# Patient Record
Sex: Female | Born: 1950 | Race: Black or African American | Hispanic: No | Smoking: Former smoker
Health system: Southern US, Community
[De-identification: ages and names within clinical notes are randomized; demographics above are authoritative.]

## PROBLEM LIST (undated history)

## (undated) DIAGNOSIS — G4730 Sleep apnea, unspecified: Secondary | ICD-10-CM

## (undated) DIAGNOSIS — I10 Essential (primary) hypertension: Secondary | ICD-10-CM

## (undated) DIAGNOSIS — R0602 Shortness of breath: Secondary | ICD-10-CM

## (undated) DIAGNOSIS — J449 Chronic obstructive pulmonary disease, unspecified: Secondary | ICD-10-CM

## (undated) DIAGNOSIS — G8929 Other chronic pain: Secondary | ICD-10-CM

## (undated) DIAGNOSIS — K219 Gastro-esophageal reflux disease without esophagitis: Secondary | ICD-10-CM

## (undated) DIAGNOSIS — E079 Disorder of thyroid, unspecified: Secondary | ICD-10-CM

## (undated) DIAGNOSIS — M542 Cervicalgia: Secondary | ICD-10-CM

## (undated) HISTORY — PX: ABDOMINAL HYSTERECTOMY: SHX81

## (undated) HISTORY — PX: KNEE ARTHROSCOPY: SHX127

## (undated) HISTORY — PX: BACK SURGERY: SHX140

## (undated) HISTORY — PX: THYROID SURGERY: SHX805

## (undated) HISTORY — PX: CERVICAL DISC SURGERY: SHX588

## (undated) HISTORY — PX: CATARACT EXTRACTION: SUR2

## (undated) HISTORY — PX: OTHER SURGICAL HISTORY: SHX169

---

## 2011-05-10 ENCOUNTER — Observation Stay (HOSPITAL_COMMUNITY)
Admission: EM | Admit: 2011-05-10 | Discharge: 2011-05-11 | DRG: 143 | Disposition: A | Discharge status: Home / Self Care | Payer: BC Managed Care – PPO | Attending: Internal Medicine | Admitting: Internal Medicine

## 2011-05-10 ENCOUNTER — Encounter: Payer: Self-pay | Admitting: *Deleted

## 2011-05-10 ENCOUNTER — Other Ambulatory Visit: Payer: Self-pay

## 2011-05-10 ENCOUNTER — Emergency Department (HOSPITAL_COMMUNITY): Payer: BC Managed Care – PPO

## 2011-05-10 DIAGNOSIS — G8929 Other chronic pain: Secondary | ICD-10-CM | POA: Diagnosis present

## 2011-05-10 DIAGNOSIS — R079 Chest pain, unspecified: Secondary | ICD-10-CM | POA: Diagnosis present

## 2011-05-10 DIAGNOSIS — R0789 Other chest pain: Principal | ICD-10-CM | POA: Diagnosis present

## 2011-05-10 DIAGNOSIS — M542 Cervicalgia: Secondary | ICD-10-CM | POA: Diagnosis present

## 2011-05-10 DIAGNOSIS — E785 Hyperlipidemia, unspecified: Secondary | ICD-10-CM | POA: Diagnosis present

## 2011-05-10 DIAGNOSIS — K219 Gastro-esophageal reflux disease without esophagitis: Secondary | ICD-10-CM | POA: Diagnosis present

## 2011-05-10 DIAGNOSIS — I10 Essential (primary) hypertension: Secondary | ICD-10-CM | POA: Diagnosis present

## 2011-05-10 DIAGNOSIS — N289 Disorder of kidney and ureter, unspecified: Secondary | ICD-10-CM | POA: Diagnosis present

## 2011-05-10 HISTORY — DX: Cervicalgia: M54.2

## 2011-05-10 HISTORY — DX: Other chronic pain: G89.29

## 2011-05-10 HISTORY — DX: Disorder of thyroid, unspecified: E07.9

## 2011-05-10 HISTORY — DX: Gastro-esophageal reflux disease without esophagitis: K21.9

## 2011-05-10 HISTORY — DX: Essential (primary) hypertension: I10

## 2011-05-10 HISTORY — DX: Shortness of breath: R06.02

## 2011-05-10 LAB — BASIC METABOLIC PANEL
BUN: 15 mg/dL (ref 6–23)
CO2: 30 mEq/L (ref 19–32)
Calcium: 10.3 mg/dL (ref 8.4–10.5)
Chloride: 102 mEq/L (ref 96–112)
Creatinine, Ser: 1.11 mg/dL — ABNORMAL HIGH (ref 0.50–1.10)
GFR calc Af Amer: 60 mL/min — ABNORMAL LOW (ref >90)
GFR calc non Af Amer: 52 mL/min — ABNORMAL LOW (ref >90)
Glucose, Bld: 106 mg/dL — ABNORMAL HIGH (ref 70–99)
Potassium: 3.6 mEq/L (ref 3.5–5.1)
Sodium: 140 mEq/L (ref 135–145)

## 2011-05-10 LAB — CBC
HCT: 42 % (ref 36.0–46.0)
Hemoglobin: 13.1 g/dL (ref 12.0–15.0)
MCH: 25 pg — ABNORMAL LOW (ref 26.0–34.0)
MCHC: 31.2 g/dL (ref 30.0–36.0)
MCV: 80.3 fL (ref 78.0–100.0)
Platelets: 269 K/uL (ref 150–400)
RBC: 5.23 MIL/uL — ABNORMAL HIGH (ref 3.87–5.11)
RDW: 14.4 % (ref 11.5–15.5)
WBC: 5.1 K/uL (ref 4.0–10.5)

## 2011-05-10 LAB — TROPONIN I: Troponin I: <0.3 ng/mL

## 2011-05-10 MED ORDER — ONDANSETRON HCL 4 MG/2ML IJ SOLN: 4.0000 mg | Freq: Four times a day (QID) | INTRAMUSCULAR | Status: DC | PRN

## 2011-05-10 MED ORDER — HYDROCHLOROTHIAZIDE 25 MG PO TABS
12.5000 mg | ORAL_TABLET | ORAL | Status: DC
  Administered 2011-05-11: 12.5 mg via ORAL
  Filled 2011-05-10: qty 1

## 2011-05-10 MED ORDER — ONDANSETRON HCL 4 MG PO TABS: 4.0000 mg | ORAL_TABLET | Freq: Four times a day (QID) | ORAL | Status: DC | PRN

## 2011-05-10 MED ORDER — ENOXAPARIN SODIUM 40 MG/0.4ML ~~LOC~~ SOLN
40.0000 mg | SUBCUTANEOUS | Status: DC
  Administered 2011-05-10: 40 mg via SUBCUTANEOUS
  Filled 2011-05-10: qty 0.4

## 2011-05-10 MED ORDER — NITROGLYCERIN 2 % TD OINT: 0.5000 [in_us] | TOPICAL_OINTMENT | Freq: Once | TRANSDERMAL | Status: DC

## 2011-05-10 MED ORDER — ONDANSETRON HCL 4 MG/2ML IJ SOLN
4.0000 mg | Freq: Three times a day (TID) | INTRAMUSCULAR | Status: DC | PRN
  Administered 2011-05-10: 4 mg via INTRAVENOUS
  Filled 2011-05-10: qty 2

## 2011-05-10 MED ORDER — PANTOPRAZOLE SODIUM 40 MG PO TBEC
40.0000 mg | DELAYED_RELEASE_TABLET | Freq: Every day | ORAL | Status: DC
  Administered 2011-05-10 – 2011-05-11 (×2): 40 mg via ORAL
  Filled 2011-05-10 (×2): qty 1

## 2011-05-10 MED ORDER — SODIUM CHLORIDE 0.9 % IV SOLN: 250.0000 mL | INTRAVENOUS | Status: DC | PRN

## 2011-05-10 MED ORDER — ALUM & MAG HYDROXIDE-SIMETH 200-200-20 MG/5ML PO SUSP: 30.0000 mL | Freq: Four times a day (QID) | ORAL | Status: DC | PRN

## 2011-05-10 MED ORDER — SODIUM CHLORIDE 0.9 % IJ SOLN: 3.0000 mL | INTRAMUSCULAR | Status: DC | PRN

## 2011-05-10 MED ORDER — QUINAPRIL HCL 10 MG PO TABS
40.0000 mg | ORAL_TABLET | ORAL | Status: DC
  Filled 2011-05-10: qty 4

## 2011-05-10 MED ORDER — ALLOPURINOL 100 MG PO TABS
100.0000 mg | ORAL_TABLET | ORAL | Status: DC
  Administered 2011-05-11: 100 mg via ORAL
  Filled 2011-05-10: qty 1

## 2011-05-10 MED ORDER — NITROGLYCERIN 2 % TD OINT
TOPICAL_OINTMENT | TRANSDERMAL | Status: AC
  Administered 2011-05-10: 23:00:00
  Filled 2011-05-10: qty 1

## 2011-05-10 MED ORDER — ASPIRIN 81 MG PO CHEW
324.0000 mg | CHEWABLE_TABLET | Freq: Once | ORAL | Status: AC
  Administered 2011-05-10: 324 mg via ORAL
  Filled 2011-05-10: qty 4

## 2011-05-10 MED ORDER — MORPHINE SULFATE 2 MG/ML IJ SOLN: 1.0000 mg | INTRAMUSCULAR | Status: DC | PRN

## 2011-05-10 MED ORDER — SODIUM CHLORIDE 0.9 % IJ SOLN
3.0000 mL | Freq: Two times a day (BID) | INTRAMUSCULAR | Status: DC
  Administered 2011-05-10 – 2011-05-11 (×2): 3 mL via INTRAVENOUS
  Filled 2011-05-10 (×2): qty 3

## 2011-05-10 MED ORDER — GABAPENTIN 300 MG PO CAPS
300.0000 mg | ORAL_CAPSULE | Freq: Four times a day (QID) | ORAL | Status: DC
  Administered 2011-05-10 – 2011-05-11 (×3): 300 mg via ORAL
  Filled 2011-05-10 (×3): qty 1

## 2011-05-10 MED ORDER — MORPHINE SULFATE 4 MG/ML IJ SOLN
4.0000 mg | Freq: Once | INTRAMUSCULAR | Status: AC
  Administered 2011-05-10: 4 mg via INTRAVENOUS
  Filled 2011-05-10: qty 1

## 2011-05-10 MED ORDER — SODIUM CHLORIDE 0.9 % IV SOLN
INTRAVENOUS | Status: AC
  Administered 2011-05-10: 20:00:00 via INTRAVENOUS

## 2011-05-10 NOTE — ED Notes (Signed)
Patient report given to this nurse. Assuming care of patient. Patient resting sitting up in bed. No distress. Equal chest rise and fall. Pain 6\10 at this time. States she usually takes pain medication for her back four times a day. Notified to hold off on taking pain medication until MD evaluates. Verbalized understanding. Call bell within reach. Bed in low position and locked with side rails up.

## 2011-05-10 NOTE — ED Notes (Signed)
Report given to Marlou Porch, RN.

## 2011-05-10 NOTE — ED Notes (Signed)
Medicated as ordered for 7/10 chest pain. Stated during morphine infusion that pressure started in middle chest. Placed on 2L O2 [place]. Instructed to take slow, deep breaths. States she is feeling better.

## 2011-05-10 NOTE — ED Notes (Signed)
Left sided cp intermittently x 2 wks - worse today with left shoulder pain and left sided back pain.  C/o sob.  Denies dizziness/n/v.  Pt alert and oriented x 4, speaking clear full sentences at this time.

## 2011-05-10 NOTE — ED Notes (Signed)
Into room to see patient. States she is having 6\10 back pain. MD states she can take her home medication of Gabapentin 300mg  PO. States her chest pain is now 7\10 at this time. Would like something for it. Denies any shortness of breath. In no distress. Call bell within reach. MD made aware.

## 2011-05-10 NOTE — ED Provider Notes (Signed)
History   This chart was scribed for Tammy Razor, MD by Clarita Crane. The patient was seen in room APA12/APA12 and the patient's care was started at 5:52PM.   CSN: [id number] Arrival date & time: 05/10/2011  4:54 PM   First MD Initiated Contact with Patient 05/10/11 1716      Chief Complaint  Patient presents with  . Chest Pain    (Consider location/radiation/quality/duration/timing/severity/associated sxs/prior treatment) HPI Tammy Castaneda is a 60 y.o. female who presents to the Emergency Department complaining of intermittent moderate to severe chest pain radiating to left upper extremitiy and upper back onset 2 weeks ago but worse today with associated SOB with exertion and fatigue. Notes chest pain is not aggravated or relieved by anything. Denies rashes, abdominal pain, nausea, vomiting. Denies h/o PE, DVT, diabetes but reports h/o hypertension and thyroid disease.  Past Medical History  Diagnosis Date  . Hypertension   . Acid reflux   . Gout   . Thyroid disease     Past Surgical History  Procedure Date  . Abdominal hysterectomy     partial  . Knee arthroscopy     right  . Thyroid surgery   . Carpel tunnel     No family history on file.  History  Substance Use Topics  . Smoking status: Never Smoker   . Smokeless tobacco: Not on file  . Alcohol Use: No    OB History    Grav Para Term Preterm Abortions TAB SAB Ect Mult Living                  Review of Systems 10 Systems reviewed and are negative for acute change except as noted in the HPI.  Allergies  Atenolol; Betadine; Butalbital; Cephalexin; Ciprofloxacin hcl; Codeine; Duratuss; E-mycin; Hydrogen peroxide; Lidoderm; Lorabid; Lorcet; Lortab; Maxzide; Otocort; Penicillins; Pentazocine; Plendil; Prednisolone; Tetracyclines & related; Ultram; and Zinc  Home Medications   Current Outpatient Rx  Name Route Sig Dispense Refill  . ALLOPURINOL 100 MG PO TABS Oral Take 100 mg by mouth every morning.        Marland Kitchen VITAMIN D 1000 UNITS PO TABS Oral Take 1,000 Units by mouth every morning.      Marland Kitchen GABAPENTIN 300 MG PO CAPS Oral Take 300 mg by mouth 4 (four) times daily.      . [employer] LEG CRAMPS PAIN RELIEF PO TABS Oral Take 1 tablet by mouth daily as needed. For cramps     . HYDROCHLOROTHIAZIDE 25 MG PO TABS Oral Take 12.5 mg by mouth every morning.      . IBUPROFEN 200 MG PO TABS Oral Take 400 mg by mouth daily as needed. For pain     . OMEPRAZOLE 20 MG PO CPDR Oral Take 20 mg by mouth every morning.      Marland Kitchen POTASSIUM 99 MG PO TABS Oral Take 1 tablet by mouth every morning.      . QUINAPRIL HCL 40 MG PO TABS Oral Take 40 mg by mouth every morning.      Marland Kitchen VITAMIN C 500 MG PO TABS Oral Take 500 mg by mouth every morning.        Pulse 69  Temp(Src) 98.7 F (37.1 C) (Oral)  Resp 20  Ht 5\' 5"  (1.651 m)  Wt 189 lb (85.73 kg)  BMI 31.45 kg/m2  SpO2 97%  Physical Exam  Nursing note and vitals reviewed. Constitutional: She is oriented to person, place, and time. She appears well-developed and well-nourished. No distress.  HENT:  Head: Normocephalic and atraumatic.  Mouth/Throat: Oropharynx is clear and moist.  Eyes: EOM are normal. Pupils are equal, round, and reactive to light.  Neck: Neck supple. No tracheal deviation present.  Cardiovascular: Normal rate and regular rhythm.  Exam reveals no gallop and no friction rub.   No murmur heard.      DP pulses intact bilaterally.   Pulmonary/Chest: Effort normal. No respiratory distress. She has no wheezes.  Abdominal: Soft. She exhibits no distension. There is no tenderness.  Musculoskeletal: Normal range of motion. She exhibits edema (trace, bilateral lower extremities. ).  Neurological: She is alert and oriented to person, place, and time. No sensory deficit.  Skin: Skin is warm and dry.  Psychiatric: She has a normal mood and affect. Her behavior is normal.    ED Course  Procedures (including critical care time)  DIAGNOSTIC STUDIES: Oxygen  Saturation is 99% on room air, normal by my interpretation.    COORDINATION OF CARE: 5:57PM- Patient informed of current treatment plan and recommendation for admission to hospital for further evaluation and treatment.  7:42PM-Consult complete with hospitalist. Patient case explained and discussed. Hospitalist agrees to admit patient for further evaluation and treatment.    Labs Reviewed  CBC - Abnormal; Notable for the following:    RBC 5.23 (*)    MCH 25.0 (*)    All other components within normal limits  BASIC METABOLIC PANEL - Abnormal; Notable for the following:    Glucose, Bld 106 (*)    Creatinine, Ser 1.11 (*)    GFR calc non Af Amer 52 (*)    GFR calc Af Amer 60 (*)    All other components within normal limits  HEPATIC FUNCTION PANEL - Abnormal; Notable for the following:    Albumin 3.3 (*)    Total Bilirubin 0.2 (*)    All other components within normal limits  TROPONIN I  CARDIAC PANEL(CRET KIN+CKTOT+MB+TROPI)  LIPASE, BLOOD  CARDIAC PANEL(CRET KIN+CKTOT+MB+TROPI)  CARDIAC PANEL(CRET KIN+CKTOT+MB+TROPI)   EKG:  Rhythm: normal sinus Rate: 68  Axis: normal Intervals:normal ST segments: normal   Dg Chest 2 View  05/10/2011  *RADIOLOGY REPORT*  Clinical Data: Chest pain.  CHEST - 2 VIEW  Comparison: None  Findings: Heart and mediastinal contours are within normal limits. No focal opacities or effusions.  No acute bony abnormality.  IMPRESSION: No active disease.  Original Report Authenticated By: Cyndie Chime, M.D.     1. Chest pain       MDM  60yf with CP. Concern for possible cardiac etiology. EKG nondiagnostic and initial trop wnl. Hx concerning, especially increasing frequency and intensity of symptoms. Hx of gerd but says feels different. Hx of htn. Admit for r/o and fruther eval.      I personally preformed the services scribed in my presence. The recorded information has been reviewed and considered. Tammy Razor, MD.    Tammy Razor,  MD 05/11/11 [id number]

## 2011-05-10 NOTE — H&P (Signed)
Chief Complaint:  Chest pain  HPI: 60 year old female with a history of hypertension hyperlipidemia who presents to the emergency department with a two-week history of waxing and waning substernal chest pressure with some pressure also in her left upper extremity. She has significant heartburn and feels like this is somewhat like her heartburn but little worse. The pain has becoming more increasingly frequent she denies any nausea or vomiting with this or any abdominal pain. Denies any shortness of breath. She has not noticed if it's worse or better with food or with exertion. She has had a stress test of her heart done was over 5 years ago reported to be normal. She has no known cardiac history.  Review of Systems:  Otherwise negative  Past Medical History: Past Medical History  Diagnosis Date  . Hypertension   . Acid reflux   . Gout   . Thyroid disease   . Shortness of breath    Past Surgical History  Procedure Date  . Abdominal hysterectomy     partial  . Knee arthroscopy     right  . Thyroid surgery   . Carpel tunnel   . Back surgery     Medications: Prior to Admission medications   Medication Sig Start Date End Date Taking? Authorizing Provider  allopurinol (ZYLOPRIM) 100 MG tablet Take 100 mg by mouth every morning.     Yes Historical Provider, MD  cholecalciferol (VITAMIN D) 1000 UNITS tablet Take 1,000 Units by mouth every morning.     Yes Historical Provider, MD  gabapentin (NEURONTIN) 300 MG capsule Take 300 mg by mouth 4 (four) times daily.     Yes Historical Provider, MD  Homeopathic Products ([employer]S LEG CRAMPS PAIN RELIEF) TABS Take 1 tablet by mouth daily as needed. For cramps    Yes Historical Provider, MD  hydrochlorothiazide (HYDRODIURIL) 25 MG tablet Take 12.5 mg by mouth every morning.     Yes Historical Provider, MD  ibuprofen (ADVIL,MOTRIN) 200 MG tablet Take 400 mg by mouth daily as needed. For pain    Yes Historical Provider, MD  omeprazole (PRILOSEC) 20 MG  capsule Take 20 mg by mouth every morning.     Yes Historical Provider, MD  Potassium 99 MG TABS Take 1 tablet by mouth every morning.     Yes Historical Provider, MD  quinapril (ACCUPRIL) 40 MG tablet Take 40 mg by mouth every morning.     Yes Historical Provider, MD  vitamin C (ASCORBIC ACID) 500 MG tablet Take 500 mg by mouth every morning.     Yes Historical Provider, MD    Allergies:   Allergies  Allergen Reactions  . Atenolol   . Betadine (Povidone Iodine)   . Butalbital   . Cephalexin   . Ciprofloxacin Hcl   . Codeine   . Duratuss ([place])   . E-Mycin (Erythromycin Base)   . Hydrogen Peroxide   . Lidoderm   . Lorabid (Loracarbef)   . Lorcet (Hydrocodone-Acetaminophen)   . Lortab   . Maxzide   . Otocort (Neomycin-Polymyxin-Hc)   . Penicillins   . Pentazocine   . Plendil   . Prednisolone   . Tetracyclines & Related     Turns eyes black  . Ultram (Tramadol Hcl)   . Zinc     Social History:  reports that she has quit smoking. Her smoking use included Cigarettes. She does not have any smokeless tobacco history on file. She reports that she does not drink alcohol or use illicit  drugs.  Family History: History reviewed. No pertinent family history.  Physical Exam: Filed Vitals:   05/10/11 1840 05/10/11 2012 05/10/11 2035 05/10/11 2111  BP: 156/76 141/74 154/74 153/83  Pulse: 67 74 73 61  Temp:   98.7 F (37.1 C) 97.3 F (36.3 C)  TempSrc:  Oral Oral   Resp: 16 20  20   Height:    5\' 5"  (1.651 m)  Weight:    84.1 kg (185 lb 6.5 oz)  SpO2: 97% 96% 99% 96%   General appearance: alert, cooperative and no distress Resp: clear to auscultation bilaterally Cardio: regular rate and rhythm, S1, S2 normal, no murmur, click, rub or gallop GI: soft, non-tender; bowel sounds normal; no masses,  no organomegaly Extremities: extremities normal, atraumatic, no cyanosis or edema Pulses: 2+ and symmetric Skin: Skin color, texture, turgor normal. No rashes or  lesions Neurologic: Grossly normal   Labs on Admission:   [facility] 05/10/11 1719  NA 140  K 3.6  CL 102  CO2 30  GLUCOSE 106*  BUN 15  CREATININE 1.11*  CALCIUM 10.3  MG --  PHOS --    Basename 05/10/11 1719  WBC 5.1  NEUTROABS --  HGB 13.1  HCT 42.0  MCV 80.3  PLT 269    Basename 05/10/11 1719  CKTOTAL --  CKMB --  CKMBINDEX --  TROPONINI <0.30    Radiological Exams on Admission: Dg Chest 2 View  05/10/2011  *RADIOLOGY REPORT*  Clinical Data: Chest pain.  CHEST - 2 VIEW  Comparison: None  Findings: Heart and mediastinal contours are within normal limits. No focal opacities or effusions.  No acute bony abnormality.  IMPRESSION: No active disease.  Original Report Authenticated By: Cyndie Chime, M.D.    Assessment/Plan Present on Admission:  .Chest pain place on telemetry 12-lead EKG is negative we'll serial cardiac enzymes and obtain a 2-D echo in the morning. She will be complete workup with outpatient stress testing unless her enzymes are positive here. We'll place her on some nitroglycerin paste at this time until she rules out. We'll also add on a lipase level and LFTs due to the atypical nature of her chest pain which could be GI in etiology.  Marland KitchenHTN (hypertension) stable  .HLD (hyperlipidemia) stable  .GERD (gastroesophageal reflux disease) Protonix  Kirsta Probert A 05/10/2011, 10:15 PM

## 2011-05-10 NOTE — ED Notes (Signed)
Attempted to call report. Nurse will call back for report

## 2011-05-11 ENCOUNTER — Encounter (HOSPITAL_COMMUNITY): Payer: Self-pay | Admitting: Internal Medicine

## 2011-05-11 ENCOUNTER — Other Ambulatory Visit: Payer: Self-pay

## 2011-05-11 DIAGNOSIS — G8929 Other chronic pain: Secondary | ICD-10-CM

## 2011-05-11 DIAGNOSIS — I517 Cardiomegaly: Secondary | ICD-10-CM

## 2011-05-11 DIAGNOSIS — M542 Cervicalgia: Secondary | ICD-10-CM | POA: Diagnosis present

## 2011-05-11 HISTORY — DX: Other chronic pain: G89.29

## 2011-05-11 LAB — CARDIAC PANEL(CRET KIN+CKTOT+MB+TROPI)
CK, MB: 3.1 ng/mL (ref 0.3–4.0)
Troponin I: <0.3 ng/mL (ref <0.30)

## 2011-05-11 LAB — TSH: TSH: 2.989 u[IU]/mL (ref 0.350–4.500)

## 2011-05-11 LAB — HEPATIC FUNCTION PANEL
AST: 17 U/L (ref 0–37)
Albumin: 3.3 g/dL — ABNORMAL LOW (ref 3.5–5.2)
Total Protein: 6.6 g/dL (ref 6.0–8.3)

## 2011-05-11 LAB — LIPASE, BLOOD: Lipase: 30 U/L (ref 11–59)

## 2011-05-11 LAB — T4, FREE: Free T4: 0.9 ng/dL (ref 0.80–1.80)

## 2011-05-11 MED ORDER — NITROGLYCERIN 2 % TD OINT: 0.5000 [in_us] | TOPICAL_OINTMENT | Freq: Three times a day (TID) | TRANSDERMAL | Status: DC

## 2011-05-11 MED ORDER — OXYCODONE-ACETAMINOPHEN 2.5-325 MG PO TABS: 1.0000 | ORAL_TABLET | ORAL | Status: AC | PRN

## 2011-05-11 MED ORDER — IBUPROFEN 800 MG PO TABS
400.0000 mg | ORAL_TABLET | ORAL | Status: DC | PRN
Start: 2011-05-11 — End: 2011-05-11
  Administered 2011-05-11: 400 mg via ORAL
  Filled 2011-05-11: qty 1

## 2011-05-11 MED ORDER — LISINOPRIL 10 MG PO TABS
40.0000 mg | ORAL_TABLET | ORAL | Status: DC
  Administered 2011-05-11: 40 mg via ORAL
  Filled 2011-05-11: qty 4

## 2011-05-11 NOTE — Progress Notes (Signed)
*  PRELIMINARY RESULTS* Echocardiogram 2D Echocardiogram has been performed.  Tammy Castaneda 05/11/2011, 10:18 AM

## 2011-05-11 NOTE — Progress Notes (Signed)
Pt discharged home with family member.  Instructed to make follow up appts with PCP and neuro MDs.  Pt instructed on how to take new meds, to follow and low sodium, heart healthy diet, and to increase activity slowly.  Pt given MD note to excuse from work.  Discharged with no complaints, and verbalizes understanding of discharge instructions

## 2011-05-11 NOTE — Discharge Summary (Signed)
Physician Discharge Summary  Tammy Castaneda MRN: [id number] DOB/AGE: [birth year] 62 y.o.  PCP: [place] care doctor is in [place], [place]   Admit date: 05/10/2011 Discharge date: 05/11/2011  Discharge Diagnoses:  1. Chest pain, myocardial infarction ruled out. 2. Chronic neck pain with a history of cervical disc surgery in the past. This may be contributing to her chest pain. No obvious radicular symptoms were noted. 3. Hypertension, well controlled. 4. Gastroesophageal reflux disease. 5. Acute renal insufficiency.   Current Discharge Medication List    START taking these medications   Details  oxycodone-acetaminophen (PERCOCET) 2.5-325 MG per tablet Take 1 tablet by mouth every 4 (four) hours as needed for pain. Qty: 30 tablet, Refills: 0      CONTINUE these medications which have NOT CHANGED   Details  allopurinol (ZYLOPRIM) 100 MG tablet Take 100 mg by mouth every morning.      cholecalciferol (VITAMIN D) 1000 UNITS tablet Take 1,000 Units by mouth every morning.      gabapentin (NEURONTIN) 300 MG capsule Take 300 mg by mouth 4 (four) times daily.      Homeopathic Products (CVS LEG CRAMPS PAIN RELIEF) TABS Take 1 tablet by mouth daily as needed. For cramps     hydrochlorothiazide (HYDRODIURIL) 25 MG tablet Take 12.5 mg by mouth every morning.      ibuprofen (ADVIL,MOTRIN) 200 MG tablet Take 400 mg by mouth daily as needed. For pain     omeprazole (PRILOSEC) 20 MG capsule Take 20 mg by mouth every morning.      Potassium 99 MG TABS Take 1 tablet by mouth every morning.      quinapril (ACCUPRIL) 40 MG tablet Take 40 mg by mouth every morning.      vitamin C (ASCORBIC ACID) 500 MG tablet Take 500 mg by mouth every morning.          Discharge Condition: Improved and stable.  Disposition: Home.   Consults: None.  Significant Diagnostic Studies: Dg Chest 2 View  05/10/2011  *RADIOLOGY REPORT*  Clinical Data: Chest pain.  CHEST - 2 VIEW  Comparison: None   Findings: Heart and mediastinal contours are within normal limits. No focal opacities or effusions.  No acute bony abnormality.  IMPRESSION: No active disease.  Original Report Authenticated By: Cyndie Chime, M.D.   ECHO: Pending  Microbiology: No results found for this or any previous visit (from the past 240 hour(s)).   Labs: Results for orders placed during the hospital encounter of 05/10/11 (from the past 48 hour(s))  CBC     Status: Abnormal   Collection Time   05/10/11  5:19 PM      Component Value Range Comment   WBC 5.1  4.0 - 10.5 (K/uL)    RBC 5.23 (*) 3.87 - 5.11 (MIL/uL)    Hemoglobin 13.1  12.0 - 15.0 (g/dL)    HCT 40.9  81.1 - 91.4 (%)    MCV 80.3  78.0 - 100.0 (fL)    MCH 25.0 (*) 26.0 - 34.0 (pg)    MCHC 31.2  30.0 - 36.0 (g/dL)    RDW 78.2  95.6 - 21.3 (%)    Platelets 269  150 - 400 (K/uL)   BASIC METABOLIC PANEL     Status: Abnormal   Collection Time   05/10/11  5:19 PM      Component Value Range Comment   Sodium 140  135 - 145 (mEq/L)    Potassium 3.6  3.5 - 5.1 (  mEq/L)    Chloride 102  96 - 112 (mEq/L)    CO2 30  19 - 32 (mEq/L)    Glucose, Bld 106 (*) 70 - 99 (mg/dL)    BUN 15  6 - 23 (mg/dL)    Creatinine, Ser 1.61 (*) 0.50 - 1.10 (mg/dL)    Calcium 09.6  8.4 - 10.5 (mg/dL)    GFR calc non Af Amer 52 (*) >90 (mL/min)    GFR calc Af Amer 60 (*) >90 (mL/min)   TROPONIN I     Status: Normal   Collection Time   05/10/11  5:19 PM      Component Value Range Comment   Troponin I <0.30  <0.30 (ng/mL)   CARDIAC PANEL(CRET KIN+CKTOT+MB+TROPI)     Status: Normal   Collection Time   05/10/11 11:54 PM      Component Value Range Comment   Total CK 151  7 - 177 (U/L)    CK, MB 3.6  0.3 - 4.0 (ng/mL)    Troponin I <0.30  <0.30 (ng/mL)    Relative Index 2.4  0.0 - 2.5    HEPATIC FUNCTION PANEL     Status: Abnormal   Collection Time   05/11/11 12:00 AM      Component Value Range Comment   Total Protein 6.6  6.0 - 8.3 (g/dL)    Albumin 3.3 (*) 3.5 - 5.2  (g/dL)    AST 17  0 - 37 (U/L)    ALT 14  0 - 35 (U/L)    Alkaline Phosphatase 90  39 - 117 (U/L)    Total Bilirubin 0.2 (*) 0.3 - 1.2 (mg/dL)    Bilirubin, Direct <0.4  0.0 - 0.3 (mg/dL)    Indirect Bilirubin NOT CALCULATED  0.3 - 0.9 (mg/dL)   LIPASE, BLOOD     Status: Normal   Collection Time   05/11/11 12:00 AM      Component Value Range Comment   Lipase 30  11 - 59 (U/L)   CARDIAC PANEL(CRET KIN+CKTOT+MB+TROPI)     Status: Abnormal   Collection Time   05/11/11  7:33 AM      Component Value Range Comment   Total CK 120  7 - 177 (U/L)    CK, MB 3.1  0.3 - 4.0 (ng/mL)    Troponin I <0.30  <0.30 (ng/mL)    Relative Index 2.6 (*) 0.0 - 2.5       HPI : The patient is a 60 year old woman with a past medical history significant for hypertension, gastroesophageal reflux disease, and cervical disc surgery, who presented to the emergency department on 05/10/2011 with a chief complaint of substernal chest pain and associated left chest pain with left arm discomfort. In the emergency department, she was noted to be mildly hypertensive, but otherwise afebrile and hemodynamically stable. Her lab data revealed a slightly increased creatinine of 1.11 and normal troponin I. Her chest x-ray revealed no active disease. Her EKG revealed normal sinus rhythm with a heart rate of 68 beats per minute and no ST or T wave abnormalities. She was admitted for further evaluation and management.  HOSPITAL COURSE: The patient was started empirically on nitroglycerin paste. Morphine was added when necessary for pain. Aspirin was started empirically. She was restarted on her chronic medications. For further evaluation, a number of studies were ordered. All of her cardiac enzymes were well within normal limits. Her followup EKG revealed normal sinus rhythm with a heart rate of 71 beats per  minute and no ST or T wave abnormalities. Her lipase and hepatic function panel were within normal limits. The results of her 2-D  echocardiogram, TSH, and free T4 were pending at the time of discharge. She complained of a headache which was thought to be secondary to the nitroglycerin paste. It was subsequently discontinued and she was given as needed ibuprofen.  Upon further questioning of the patient, she reported that she had chronic neck pain and  had cervical disc surgery at [facility] last year. She also reported that she does heavy lifting at work. It is possible, that the patient has radiating pain from her neck to her chest and arm. She was advised to followup with the surgeon who performed her cervical disc surgery in the next 2-4 weeks. She was advised to followup with her primary care physician regarding work/lifting limitations. She was given a prescription for Percocet as needed for pain. Of note, the patient has no true allergy to oxycodone, rather, she becomes very sleepy when she takes it. She was advised not to drive when taking Percocet.  The patient became virtually chest pain free. She remained hemodynamically stable and afebrile. She was discharged to home in improved and stable condition. She will followup with her neurosurgeon and primary care physician as recommended.    Discharge Exam: Blood pressure 115/72, pulse 61, temperature 97.8 F (36.6 C), temperature source Oral, resp. rate 20, height 5\' 5"  (1.651 m), weight 84.1 kg (185 lb 6.5 oz), SpO2 95.00%. Neck: Supple, good range of motion, mild cervical muscular tenderness posteriorly. Lungs: Clear to auscultation bilaterally. Chest wall, no pectoral tenderness. Heart: S1, S2, with no murmurs rubs or gallops. Abdomen: Obese, positive bowel sounds, nontender, and nondistended. Extremities: No pedal edema. Neurologic: Alert and oriented x3. Cranial nerves II through XII are intact. Strength in both arms is 5 over 5 throughout. Sensation is grossly intact in all of the extremities and symmetrically.   Discharge Orders    Future Orders Please Complete By  Expires   Diet - low sodium heart healthy      Increase activity slowly      Discharge instructions      Comments:   FOLLOW UP WITH YOUR NECK SURGEON AS SCHEDULED IN A FEW WEEKS.        Follow-up Information    Please follow up. (F/UP WITH YOUR NECK SURGEON IN 2-4 WEEKS)       Please follow up. (F/UP WITH YOUR PRIMARY DOCTOR IN 1 WEEK.)          Signed: Sujata Maines 05/11/2011, 2:15 PM

## 2018-12-18 ENCOUNTER — Other Ambulatory Visit: Payer: Self-pay | Admitting: Podiatry

## 2019-01-01 NOTE — Patient Instructions (Signed)
Tammy Castaneda  01/01/2019     @PREFPERIOPPHARMACY @   Your procedure is scheduled on  01/09/2019.  Report to [facility] at  700  A.M.  Call this number if you have problems the morning of surgery:  [phone]   Remember:  Do not eat or drink after midnight.                         Take these medicines the morning of surgery with A SIP OF WATER  Allopurinol, gabapentin, prilosec.    Do not wear jewelry, make-up or nail polish.  Do not wear lotions, powders, or perfumes, or deodorant.  Do not shave 48 hours prior to surgery.  Men may shave face and neck.  Do not bring valuables to the hospital.  [facility] is not responsible for any belongings or valuables.  Contacts, dentures or bridgework may not be worn into surgery.  Leave your suitcase in the car.  After surgery it may be brought to your room.  For patients admitted to the hospital, discharge time will be determined by your treatment team.  Patients discharged the day of surgery will not be allowed to drive home.   Name and phone number of your driver:   family Special instructions:  None  Please read over the following fact sheets that you were given. Anesthesia Post-op Instructions and Care and Recovery After Surgery       Metatarsal Osteotomy, Care After This sheet gives you information about how to care for yourself after your procedure. Your health care provider may also give you more specific instructions. If you have problems or questions, contact your health care provider. What can I expect after the procedure? After the procedure, it is common to have:  Soreness.  Pain.  Stiffness.  Swelling. Follow these instructions at home: Medicines  Take over-the-counter and prescription medicines only as told by your health care provider.  Ask your health care provider if the medicine prescribed to you can cause constipation. You may need to take steps to prevent or treat constipation, such as:  ? Drink enough fluid to keep your urine pale yellow. ? Take over-the-counter or prescription medicines. ? Eat foods that are high in fiber, such as beans, whole grains, and fresh fruits and vegetables. ? Limit foods that are high in fat and processed sugars, such as fried or sweet foods. If you have a splint or walking boot:  Wear it as told by your health care provider. Remove it only as told by your health care provider.  Loosen it if your toes tingle, become numb, or turn cold and blue.  Keep it clean.  If it is not waterproof: ? Do not let it get wet. ? Cover it with a watertight covering when you take a bath or shower. Bathing  Do not take baths, swim, or use a hot tub until your health care provider approves. Ask your health care provider if you may take showers. You may only be allowed to take sponge baths.  Keep the bandage (dressing) dry. Incision care   Follow instructions from your health care provider about how to take care of your incision. Make sure you: ? Wash your hands with soap and water before you change your bandage (dressing). If soap and water are not available, use hand sanitizer. ? Change your dressing as told by your health care provider. ? Leave stitches (sutures),  skin glue, or adhesive strips in place. These skin closures may need to stay in place for 2 weeks or longer. If adhesive strip edges start to loosen and curl up, you may trim the loose edges. Do not remove adhesive strips completely unless your health care provider tells you to do that.  Check your incision area every day for signs of infection. Check for: ? More redness, swelling, or pain. ? Fluid or blood. ? Warmth. ? Pus or a bad smell. Managing pain, stiffness, and swelling   If directed, put ice on the injured area. ? If you have a removable splint, remove it as told by your health care provider. ? Put ice in a plastic bag. ? Place a towel between your skin and the bag. ? Leave the  ice on for 20 minutes, 2-3 times a day.  Move your toes often to avoid stiffness and to lessen swelling.  Raise (elevate) the injured area above the level of your heart while you are sitting or lying down. Driving  Do not drive or use heavy machinery while taking prescription pain medicine.  Do not drive for 24 hours if you were given a sedative during your procedure.  Ask your health care provider when it is safe to drive if you have a dressing, splint, special shoe, or walking boot on your foot. General instructions  Do not remove the bandage (dressing) around your foot until directed by your health care provider.  If you were given a splint, special shoe, or walking boot, wear it as told by your health care provider.  Do not use the injured limb to support your body weight until your health care provider says that you can. Use crutches or a walker as told by your health care provider.  Return to your normal activities as told by your health care provider. Ask your health care provider what activities are safe for you.  Do not use any products that contain nicotine or tobacco, such as cigarettes, e-cigarettes, and chewing tobacco. These can delay bone healing. If you need help quitting, ask your health care provider.  Keep all follow-up visits as told by your health care provider. This is important. Contact a health care provider if:  You have a fever.  Your dressing becomes wet, loose, or stained with blood or discharge.  You have pus or a bad smell coming from your incision or bandage.  Your foot becomes red, swollen, or tender.  You have pain or stiffness that does not get better or gets worse.  You have tingling or numbness in your foot that does not get better or gets worse. Get help right away if:  You develop a warm and tender swelling in your leg.  You have chest pain.  You have trouble breathing. Summary  After the procedure, it is common to have soreness,  pain, stiffness, and swelling.  Follow instructions on caring for your incision, changing your dressing, and using weight support to protect your foot.  Contact your health care provider if you have a fever, pus or a bad smell coming from your wound or dressing, or pain and stiffness do not get better.  Get help right away if you develop a warm and tender swelling in your leg, have chest pain, or trouble breathing. This information is not intended to replace advice given to you by your health care provider. Make sure you discuss any questions you have with your health care provider. Document Released: 04/27/2015 Document Revised:  03/30/2018 Document Reviewed: 03/30/2018 Elsevier Patient Education  2020 [employer]  Monitored Anesthesia Care, Care After These instructions provide you with information about caring for yourself after your procedure. Your health care provider may also give you more specific instructions. Your treatment has been planned according to current medical practices, but problems sometimes occur. Call your health care provider if you have any problems or questions after your procedure. What can I expect after the procedure? After your procedure, you may:  Feel sleepy for several hours.  Feel clumsy and have poor balance for several hours.  Feel forgetful about what happened after the procedure.  Have poor judgment for several hours.  Feel nauseous or vomit.  Have a sore throat if you had a breathing tube during the procedure. Follow these instructions at home: For at least 24 hours after the procedure:      Have a responsible adult stay with you. It is important to have someone help care for you until you are awake and alert.  Rest as needed.  Do not: ? Participate in activities in which you could fall or become injured. ? Drive. ? Use heavy machinery. ? Drink alcohol. ? Take sleeping pills or medicines that cause drowsiness. ? Make important  decisions or sign legal documents. ? Take care of children on your own. Eating and drinking  Follow the diet that is recommended by your health care provider.  If you vomit, drink water, juice, or soup when you can drink without vomiting.  Make sure you have little or no nausea before eating solid foods. General instructions  Take over-the-counter and prescription medicines only as told by your health care provider.  If you have sleep apnea, surgery and certain medicines can increase your risk for breathing problems. Follow instructions from your health care provider about wearing your sleep device: ? Anytime you are sleeping, including during daytime naps. ? While taking prescription pain medicines, sleeping medicines, or medicines that make you drowsy.  If you smoke, do not smoke without supervision.  Keep all follow-up visits as told by your health care provider. This is important. Contact a health care provider if:  You keep feeling nauseous or you keep vomiting.  You feel light-headed.  You develop a rash.  You have a fever. Get help right away if:  You have trouble breathing. Summary  For several hours after your procedure, you may feel sleepy and have poor judgment.  Have a responsible adult stay with you for at least 24 hours or until you are awake and alert. This information is not intended to replace advice given to you by your health care provider. Make sure you discuss any questions you have with your health care provider. Document Released: 09/06/2015 Document Revised: 08/14/2017 Document Reviewed: 09/06/2015 Elsevier Patient Education  2020 [employer] How to Use Chlorhexidine for Bathing Chlorhexidine gluconate (CHG) is a germ-killing (antiseptic) solution that is used to clean the skin. It can get rid of the bacteria that normally live on the skin and can keep them away for about 24 hours. To clean your skin with CHG, you may be given:  A CHG solution to  use in the shower or as part of a sponge bath.  A prepackaged cloth that contains CHG. Cleaning your skin with CHG may help lower the risk for infection:  While you are staying in the intensive care unit of the hospital.  If you have a vascular access, such as a central line, to provide short-term  or long-term access to your veins.  If you have a catheter to drain urine from your bladder.  If you are on a ventilator. A ventilator is a machine that helps you breathe by moving air in and out of your lungs.  After surgery. What are the risks? Risks of using CHG include:  A skin reaction.  Hearing loss, if CHG gets in your ears.  Eye injury, if CHG gets in your eyes and is not rinsed out.  The CHG product catching fire. Make sure that you avoid smoking and flames after applying CHG to your skin. Do not use CHG:  If you have a chlorhexidine allergy or have previously reacted to chlorhexidine.  On babies younger than 482 months of age. How to use CHG solution  Use CHG only as told by your health care provider, and follow the instructions on the label.  Use the full amount of CHG as directed. Usually, this is one bottle. During a shower Follow these steps when using CHG solution during a shower (unless your health care provider gives you different instructions): 1. Start the shower. 2. Use your normal soap and shampoo to wash your face and hair. 3. Turn off the shower or move out of the shower stream. 4. Pour the CHG onto a clean washcloth. Do not use any type of brush or rough-edged sponge. 5. Starting at your neck, lather your body down to your toes. Make sure you follow these instructions: ? If you will be having surgery, pay special attention to the part of your body where you will be having surgery. Scrub this area for at least 1 minute. ? Do not use CHG on your head or face. If the solution gets into your ears or eyes, rinse them well with water. ? Avoid your genital area. ?  Avoid any areas of skin that have broken skin, cuts, or scrapes. ? Scrub your back and under your arms. Make sure to wash skin folds. 6. Let the lather sit on your skin for 1-2 minutes or as long as told by your health care provider. 7. Thoroughly rinse your entire body in the shower. Make sure that all body creases and crevices are rinsed well. 8. Dry off with a clean towel. Do not put any substances on your body afterward-such as powder, lotion, or perfume-unless you are told to do so by your health care provider. Only use lotions that are recommended by the manufacturer. 9. Put on clean clothes or pajamas. 10. If it is the night before your surgery, sleep in clean sheets.  During a sponge bath Follow these steps when using CHG solution during a sponge bath (unless your health care provider gives you different instructions): 1. Use your normal soap and shampoo to wash your face and hair. 2. Pour the CHG onto a clean washcloth. 3. Starting at your neck, lather your body down to your toes. Make sure you follow these instructions: ? If you will be having surgery, pay special attention to the part of your body where you will be having surgery. Scrub this area for at least 1 minute. ? Do not use CHG on your head or face. If the solution gets into your ears or eyes, rinse them well with water. ? Avoid your genital area. ? Avoid any areas of skin that have broken skin, cuts, or scrapes. ? Scrub your back and under your arms. Make sure to wash skin folds. 4. Let the lather sit on your skin for  1-2 minutes or as long as told by your health care provider. 5. Using a different clean, wet washcloth, thoroughly rinse your entire body. Make sure that all body creases and crevices are rinsed well. 6. Dry off with a clean towel. Do not put any substances on your body afterward-such as powder, lotion, or perfume-unless you are told to do so by your health care provider. Only use lotions that are recommended by  the manufacturer. 7. Put on clean clothes or pajamas. 8. If it is the night before your surgery, sleep in clean sheets. How to use CHG prepackaged cloths  Only use CHG cloths as told by your health care provider, and follow the instructions on the label.  Use the CHG cloth on clean, dry skin.  Do not use the CHG cloth on your head or face unless your health care provider tells you to.  When washing with the CHG cloth: ? Avoid your genital area. ? Avoid any areas of skin that have broken skin, cuts, or scrapes. Before surgery Follow these steps when using a CHG cloth to clean before surgery (unless your health care provider gives you different instructions): 1. Using the CHG cloth, vigorously scrub the part of your body where you will be having surgery. Scrub using a back-and-forth motion for 3 minutes. The area on your body should be completely wet with CHG when you are done scrubbing. 2. Do not rinse. Discard the cloth and let the area air-dry. Do not put any substances on the area afterward, such as powder, lotion, or perfume. 3. Put on clean clothes or pajamas. 4. If it is the night before your surgery, sleep in clean sheets.  For general bathing Follow these steps when using CHG cloths for general bathing (unless your health care provider gives you different instructions). 1. Use a separate CHG cloth for each area of your body. Make sure you wash between any folds of skin and between your fingers and toes. Wash your body in the following order, switching to a new cloth after each step: ? The front of your neck, shoulders, and chest. ? Both of your arms, under your arms, and your hands. ? Your stomach and groin area, avoiding the genitals. ? Your right leg and foot. ? Your left leg and foot. ? The back of your neck, your back, and your buttocks. 2. Do not rinse. Discard the cloth and let the area air-dry. Do not put any substances on your body afterward-such as powder, lotion, or  perfume-unless you are told to do so by your health care provider. Only use lotions that are recommended by the manufacturer. 3. Put on clean clothes or pajamas. Contact a health care provider if:  Your skin gets irritated after scrubbing.  You have questions about using your solution or cloth. Get help right away if:  Your eyes become very red or swollen.  Your eyes itch badly.  Your skin itches badly and is red or swollen.  Your hearing changes.  You have trouble seeing.  You have swelling or tingling in your mouth or throat.  You have trouble breathing.  You swallow any chlorhexidine. Summary  Chlorhexidine gluconate (CHG) is a germ-killing (antiseptic) solution that is used to clean the skin. Cleaning your skin with CHG may help to lower your risk for infection.  You may be given CHG to use for bathing. It may be in a bottle or in a prepackaged cloth to use on your skin. Carefully follow your health  care provider's instructions and the instructions on the product label.  Do not use CHG if you have a chlorhexidine allergy.  Contact your health care provider if your skin gets irritated after scrubbing. This information is not intended to replace advice given to you by your health care provider. Make sure you discuss any questions you have with your health care provider. Document Released: 02/08/2012 Document Revised: 08/02/2018 Document Reviewed: 04/13/2017 Elsevier Patient Education  2020 [employer].

## 2019-01-04 ENCOUNTER — Encounter (HOSPITAL_COMMUNITY): Payer: Self-pay

## 2019-01-04 ENCOUNTER — Other Ambulatory Visit: Payer: Self-pay

## 2019-01-04 ENCOUNTER — Ambulatory Visit (HOSPITAL_COMMUNITY)
Admission: RE | Admit: 2019-01-04 | Discharge: 2019-01-04 | Disposition: A | Discharge status: Home / Self Care | Payer: Medicare Other | Source: Ambulatory Visit | Attending: Podiatry | Admitting: Podiatry

## 2019-01-04 ENCOUNTER — Encounter (HOSPITAL_COMMUNITY)
Admission: RE | Admit: 2019-01-04 | Discharge: 2019-01-04 | Disposition: A | Discharge status: Home / Self Care | Payer: Medicare Other | Source: Ambulatory Visit | Attending: Podiatry | Admitting: Podiatry

## 2019-01-04 ENCOUNTER — Other Ambulatory Visit (HOSPITAL_COMMUNITY)
Admission: RE | Admit: 2019-01-04 | Discharge: 2019-01-04 | Disposition: A | Discharge status: Home / Self Care | Payer: Medicare Other | Source: Ambulatory Visit | Attending: Podiatry | Admitting: Podiatry

## 2019-01-04 DIAGNOSIS — M79671 Pain in right foot: Secondary | ICD-10-CM | POA: Insufficient documentation

## 2019-01-04 DIAGNOSIS — Z01818 Encounter for other preprocedural examination: Secondary | ICD-10-CM | POA: Insufficient documentation

## 2019-01-04 DIAGNOSIS — Z20828 Contact with and (suspected) exposure to other viral communicable diseases: Secondary | ICD-10-CM | POA: Insufficient documentation

## 2019-01-04 HISTORY — DX: Chronic obstructive pulmonary disease, unspecified: J44.9

## 2019-01-04 HISTORY — DX: Sleep apnea, unspecified: G47.30

## 2019-01-04 LAB — BASIC METABOLIC PANEL
Anion gap: 9 (ref 5–15)
BUN: 22 mg/dL (ref 8–23)
CO2: 27 mmol/L (ref 22–32)
Calcium: 9.5 mg/dL (ref 8.9–10.3)
Chloride: 104 mmol/L (ref 98–111)
Creatinine, Ser: 1.19 mg/dL — ABNORMAL HIGH (ref 0.44–1.00)
GFR calc Af Amer: 54 mL/min — ABNORMAL LOW (ref >60)
GFR calc non Af Amer: 47 mL/min — ABNORMAL LOW (ref >60)
Glucose, Bld: 88 mg/dL (ref 70–99)
Potassium: 3.7 mmol/L (ref 3.5–5.1)
Sodium: 140 mmol/L (ref 135–145)

## 2019-01-05 LAB — SARS CORONAVIRUS 2 (TAT 6-24 HRS): SARS Coronavirus 2: NEGATIVE

## 2019-01-09 ENCOUNTER — Ambulatory Visit (HOSPITAL_COMMUNITY): Payer: Medicare Other | Admitting: Anesthesiology

## 2019-01-09 ENCOUNTER — Encounter (HOSPITAL_COMMUNITY)
Admission: RE | Disposition: A | Discharge status: Home / Self Care | Payer: Self-pay | Source: Home / Self Care | Attending: Podiatry

## 2019-01-09 ENCOUNTER — Encounter (HOSPITAL_COMMUNITY): Payer: Self-pay | Admitting: *Deleted

## 2019-01-09 ENCOUNTER — Ambulatory Visit (HOSPITAL_COMMUNITY)
Admission: RE | Admit: 2019-01-09 | Discharge: 2019-01-09 | Disposition: A | Discharge status: Home / Self Care | Payer: Medicare Other | Attending: Podiatry | Admitting: Podiatry

## 2019-01-09 ENCOUNTER — Ambulatory Visit (HOSPITAL_COMMUNITY): Payer: Medicare Other

## 2019-01-09 ENCOUNTER — Other Ambulatory Visit: Payer: Self-pay

## 2019-01-09 DIAGNOSIS — Z9889 Other specified postprocedural states: Secondary | ICD-10-CM

## 2019-01-09 DIAGNOSIS — K219 Gastro-esophageal reflux disease without esophagitis: Secondary | ICD-10-CM | POA: Insufficient documentation

## 2019-01-09 DIAGNOSIS — Z79899 Other long term (current) drug therapy: Secondary | ICD-10-CM | POA: Diagnosis not present

## 2019-01-09 DIAGNOSIS — J449 Chronic obstructive pulmonary disease, unspecified: Secondary | ICD-10-CM | POA: Diagnosis not present

## 2019-01-09 DIAGNOSIS — Q742 Other congenital malformations of lower limb(s), including pelvic girdle: Secondary | ICD-10-CM | POA: Diagnosis present

## 2019-01-09 DIAGNOSIS — M205X1 Other deformities of toe(s) (acquired), right foot: Secondary | ICD-10-CM | POA: Insufficient documentation

## 2019-01-09 DIAGNOSIS — Z7982 Long term (current) use of aspirin: Secondary | ICD-10-CM | POA: Diagnosis not present

## 2019-01-09 DIAGNOSIS — G4730 Sleep apnea, unspecified: Secondary | ICD-10-CM | POA: Insufficient documentation

## 2019-01-09 DIAGNOSIS — M2011 Hallux valgus (acquired), right foot: Secondary | ICD-10-CM | POA: Insufficient documentation

## 2019-01-09 DIAGNOSIS — M2041 Other hammer toe(s) (acquired), right foot: Secondary | ICD-10-CM | POA: Insufficient documentation

## 2019-01-09 DIAGNOSIS — Z87891 Personal history of nicotine dependence: Secondary | ICD-10-CM | POA: Insufficient documentation

## 2019-01-09 DIAGNOSIS — I10 Essential (primary) hypertension: Secondary | ICD-10-CM | POA: Insufficient documentation

## 2019-01-09 DIAGNOSIS — M79671 Pain in right foot: Secondary | ICD-10-CM | POA: Diagnosis not present

## 2019-01-09 HISTORY — PX: BUNIONECTOMY: SHX129

## 2019-01-09 HISTORY — PX: HAMMER TOE SURGERY: SHX385

## 2019-01-09 HISTORY — PX: FOOT ARTHRODESIS: SHX1655

## 2019-01-09 HISTORY — PX: EXCISION OF ACCESSORY OSSICLE: SHX6460

## 2019-01-09 SURGERY — EXCISION OF ACCESSORY OSSICLE
Anesthesia: General | Laterality: Right

## 2019-01-09 MED ORDER — CHLORHEXIDINE GLUCONATE CLOTH 2 % EX PADS: 6.0000 | MEDICATED_PAD | Freq: Once | CUTANEOUS | Status: DC

## 2019-01-09 MED ORDER — MIDAZOLAM HCL 5 MG/5ML IJ SOLN
INTRAMUSCULAR | Status: DC | PRN
  Administered 2019-01-09 (×2): 1 mg via INTRAVENOUS

## 2019-01-09 MED ORDER — LIDOCAINE HCL (PF) 1 % IJ SOLN
INTRAMUSCULAR | Status: AC
  Filled 2019-01-09: qty 2

## 2019-01-09 MED ORDER — LACTATED RINGERS IV SOLN
INTRAVENOUS | Status: DC
  Administered 2019-01-09: 1000 mL via INTRAVENOUS
  Administered 2019-01-09: 11:00:00 via INTRAVENOUS

## 2019-01-09 MED ORDER — MIDAZOLAM HCL 2 MG/2ML IJ SOLN: 0.5000 mg | Freq: Once | INTRAMUSCULAR | Status: DC | PRN

## 2019-01-09 MED ORDER — FENTANYL CITRATE (PF) 250 MCG/5ML IJ SOLN
INTRAMUSCULAR | Status: AC
  Filled 2019-01-09: qty 5

## 2019-01-09 MED ORDER — FENTANYL CITRATE (PF) 100 MCG/2ML IJ SOLN
INTRAMUSCULAR | Status: AC
  Filled 2019-01-09: qty 2

## 2019-01-09 MED ORDER — SODIUM CHLORIDE 0.9 % IR SOLN
Status: DC | PRN
  Administered 2019-01-09: 1000 mL

## 2019-01-09 MED ORDER — BUPIVACAINE HCL (PF) 0.5 % IJ SOLN
INTRAMUSCULAR | Status: AC
  Filled 2019-01-09: qty 90

## 2019-01-09 MED ORDER — PROPOFOL 10 MG/ML IV BOLUS
INTRAVENOUS | Status: DC | PRN
  Administered 2019-01-09: 20 mg via INTRAVENOUS
  Administered 2019-01-09: 10 mg via INTRAVENOUS
  Administered 2019-01-09 (×3): 20 mg via INTRAVENOUS
  Administered 2019-01-09: 10 mg via INTRAVENOUS

## 2019-01-09 MED ORDER — BUPIVACAINE HCL (PF) 0.5 % IJ SOLN
INTRAMUSCULAR | Status: DC | PRN
  Administered 2019-01-09: 20 mL
  Administered 2019-01-09: 30 mL

## 2019-01-09 MED ORDER — CLINDAMYCIN PHOSPHATE 900 MG/50ML IV SOLN
900.0000 mg | INTRAVENOUS | Status: AC
  Administered 2019-01-09: 09:00:00 900 mg via INTRAVENOUS
  Filled 2019-01-09: qty 50

## 2019-01-09 MED ORDER — PROPOFOL 500 MG/50ML IV EMUL
INTRAVENOUS | Status: DC | PRN
  Administered 2019-01-09: 75 ug/kg/min via INTRAVENOUS

## 2019-01-09 MED ORDER — BACITRACIN 500 UNIT/GM EX OINT
TOPICAL_OINTMENT | CUTANEOUS | Status: DC | PRN
  Administered 2019-01-09: 1 via TOPICAL

## 2019-01-09 MED ORDER — KETAMINE HCL 10 MG/ML IJ SOLN
INTRAMUSCULAR | Status: DC | PRN
  Administered 2019-01-09: 10 mg via INTRAVENOUS

## 2019-01-09 MED ORDER — BACITRACIN ZINC 500 UNIT/GM EX OINT
TOPICAL_OINTMENT | CUTANEOUS | Status: AC
  Filled 2019-01-09: qty 0.9

## 2019-01-09 MED ORDER — KETAMINE HCL 50 MG/5ML IJ SOSY
PREFILLED_SYRINGE | INTRAMUSCULAR | Status: AC
  Filled 2019-01-09: qty 5

## 2019-01-09 MED ORDER — LIDOCAINE HCL (PF) 1 % IJ SOLN
INTRAMUSCULAR | Status: AC
  Filled 2019-01-09: qty 30

## 2019-01-09 MED ORDER — MIDAZOLAM HCL 2 MG/2ML IJ SOLN
INTRAMUSCULAR | Status: AC
  Filled 2019-01-09: qty 2

## 2019-01-09 MED ORDER — FENTANYL CITRATE (PF) 100 MCG/2ML IJ SOLN
INTRAMUSCULAR | Status: DC | PRN
  Administered 2019-01-09 (×3): 50 ug via INTRAVENOUS
  Administered 2019-01-09 (×2): 25 ug via INTRAVENOUS

## 2019-01-09 MED ORDER — PROMETHAZINE HCL 25 MG/ML IJ SOLN: 6.2500 mg | INTRAMUSCULAR | Status: DC | PRN

## 2019-01-09 MED ORDER — PROPOFOL 10 MG/ML IV BOLUS
INTRAVENOUS | Status: AC
  Filled 2019-01-09: qty 40

## 2019-01-09 MED ORDER — LIDOCAINE HCL (PF) 1 % IJ SOLN
INTRAMUSCULAR | Status: DC | PRN
  Administered 2019-01-09: 10 mL

## 2019-01-09 SURGICAL SUPPLY — 58 items
BANDAGE ACE 4X5 VEL STRL LF (GAUZE/BANDAGES/DRESSINGS) ×2 IMPLANT
BANDAGE ELASTIC 4 LF NS (GAUZE/BANDAGES/DRESSINGS) ×2 IMPLANT
BANDAGE ESMARK 4X12 BL STRL LF (DISPOSABLE) ×1 IMPLANT
BENZOIN TINCTURE PRP APPL 2/3 (GAUZE/BANDAGES/DRESSINGS) ×2 IMPLANT
BIT DRILL LNG QR ACUTRAK 2 (BIT) ×2 IMPLANT
BIT DRILL MICR ACTRK 2 LNG PRF (BIT) ×1 IMPLANT
BLADE AVERAGE 25X9 (BLADE) ×2 IMPLANT
BLADE OSC/SAG 11.5X5.5X.38 (BLADE) ×2 IMPLANT
BLADE SURG 15 STRL LF DISP TIS (BLADE) ×2 IMPLANT
BLADE SURG 15 STRL SS (BLADE) ×2
BNDG CONFORM 2 STRL LF (GAUZE/BANDAGES/DRESSINGS) ×2 IMPLANT
BNDG ESMARK 4X12 BLUE STRL LF (DISPOSABLE) ×2
BNDG GAUZE ELAST 4 BULKY (GAUZE/BANDAGES/DRESSINGS) ×2 IMPLANT
CAP PIN PROTECTOR ORTHO WHT (CAP) ×4 IMPLANT
CHLORAPREP W/TINT 26 (MISCELLANEOUS) ×2 IMPLANT
CLOTH BEACON ORANGE TIMEOUT ST (SAFETY) ×2 IMPLANT
COVER LIGHT HANDLE STERIS (MISCELLANEOUS) ×4 IMPLANT
COVER WAND RF STERILE (DRAPES) ×2 IMPLANT
CUFF TOURNIQUET SINGLE 18IN (TOURNIQUET CUFF) ×2 IMPLANT
DECANTER SPIKE VIAL GLASS SM (MISCELLANEOUS) ×4 IMPLANT
DRAPE OEC MINIVIEW 54X84 (DRAPES) ×2 IMPLANT
DRILL MICRO ACUTRAK 2 LNG PROF (BIT) ×2
DRSG ADAPTIC 3X8 NADH LF (GAUZE/BANDAGES/DRESSINGS) ×2 IMPLANT
DURA STEPPER MED (CAST SUPPLIES) ×2 IMPLANT
ELECT REM PT RETURN 9FT ADLT (ELECTROSURGICAL) ×2
ELECTRODE REM PT RTRN 9FT ADLT (ELECTROSURGICAL) ×1 IMPLANT
GAUZE SPONGE 4X4 12PLY STRL (GAUZE/BANDAGES/DRESSINGS) ×4 IMPLANT
GLOVE BIO SURGEON STRL SZ7.5 (GLOVE) ×2 IMPLANT
GLOVE BIOGEL PI IND STRL 7.0 (GLOVE) ×4 IMPLANT
GLOVE BIOGEL PI IND STRL 7.5 (GLOVE) ×1 IMPLANT
GLOVE BIOGEL PI INDICATOR 7.0 (GLOVE) ×4
GLOVE BIOGEL PI INDICATOR 7.5 (GLOVE) ×1
GLOVE ECLIPSE 6.5 STRL STRAW (GLOVE) ×2 IMPLANT
GLOVE ECLIPSE 7.0 STRL STRAW (GLOVE) ×2 IMPLANT
GOWN STRL REUS W/ TWL LRG LVL3 (GOWN DISPOSABLE) ×1 IMPLANT
GOWN STRL REUS W/TWL LRG LVL3 (GOWN DISPOSABLE) ×9 IMPLANT
GUIDEWIRE ORTHO MICROSHT  ACUT (WIRE) ×1
GUIDEWIRE ORTHO MICROSHT .035 (WIRE) ×1 IMPLANT
K-WIRE 6 (WIRE) ×2
KIT TURNOVER KIT A (KITS) ×2 IMPLANT
KWIRE 6 (WIRE) ×1 IMPLANT
MANIFOLD NEPTUNE II (INSTRUMENTS) ×2 IMPLANT
NEEDLE HYPO 21X1.5 SAFETY (NEEDLE) ×2 IMPLANT
NEEDLE HYPO 27GX1-1/4 (NEEDLE) ×8 IMPLANT
NS IRRIG 1000ML POUR BTL (IV SOLUTION) ×2 IMPLANT
PACK BASIC LIMB (CUSTOM PROCEDURE TRAY) ×2 IMPLANT
PAD ABD 5X9 TENDERSORB (GAUZE/BANDAGES/DRESSINGS) ×2 IMPLANT
PAD ARMBOARD 7.5X6 YLW CONV (MISCELLANEOUS) ×2 IMPLANT
SCREW ACUTRAK 2 STD 22MM (Screw) ×2 IMPLANT
SCREW ACUTRAK 2 STD 34MM (Screw) ×2 IMPLANT
SET BASIN LINEN APH (SET/KITS/TRAYS/PACK) ×2 IMPLANT
SPONGE LAP 18X18 RF (DISPOSABLE) ×2 IMPLANT
STRIP CLOSURE SKIN 1/2X4 (GAUZE/BANDAGES/DRESSINGS) ×2 IMPLANT
SUT ETHILON 4 0 P 3 18 (SUTURE) ×2 IMPLANT
SUT VIC AB 2-0 CT2 27 (SUTURE) ×2 IMPLANT
SUT VIC AB 4-0 PS2 27 (SUTURE) ×2 IMPLANT
SUT VICRYL AB 3-0 FS1 BRD 27IN (SUTURE) ×2 IMPLANT
SYR CONTROL 10ML LL (SYRINGE) ×6 IMPLANT

## 2019-01-09 NOTE — Brief Op Note (Signed)
BRIEF OPERATIVE NOTE  DATE OF PROCEDURE 01/09/2019  SURGEON Marcheta Grammes, DPM  ASSISTANT SURGEON Jilda Panda, DPM  OR STAFF Circulator: Jeanell Sparrow Fransisco Hertz, RN Relief Circulator: Edrick Kins, RN Relief Scrub: Lucie Leather, CST Scrub Person: Towanda Malkin, RN Vendor Representative : Georgiana Spinner   PREOPERATIVE DIAGNOSIS 1.  Hallux limitus, right foot 2.  Hallux valgus, right foot 3.  Hallux extensus, right foot 4.  Accessory bone, right hallux 5.  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  Pain, right foot  POSTOPERATIVE DIAGNOSIS Same  PROCEDURE 1.  Modified Waterman osteotomy of first metatarsal, right foot 2.  Excision of accessory bone, right hallux 3.  Arthrodesis of the interphalangeal joint, right hallux 4.  Hammertoe repair of the 2nd digit, right foot  ANESTHESIA Monitor Anesthesia Care   HEMOSTASIS Pneumatic ankle tourniquet set at 250 mmHg  ESTIMATED BLOOD LOSS Minimal (<5 cc)  MATERIALS USED Screw x 2 0.045" K-wire  INJECTABLES 0.5% Marcaine plain  PATHOLOGY Accessory bone, right hallux  COMPLICATIONS None

## 2019-01-09 NOTE — H&P (Signed)
HISTORY AND PHYSICAL INTERVAL NOTE:  01/09/2019  8:16 AM  Tammy Castaneda  has presented today for surgery, with the diagnosis of accessory bone right hallux, hallux extensus, hallux limitus, hallux valgus, pain right foot.  The various methods of treatment have been discussed with the patient.  No guarantees were given.  After consideration of risks, benefits and other options for treatment, the patient has consented to surgery.  I have reviewed the patients' chart and labs.    Patient Vitals for the past 24 hrs:  BP Temp Temp src Resp SpO2  01/09/19 0744 (!) 147/72 98.4 F (36.9 C) Oral 18 97 %    A history and physical examination was performed in my office.  The patient was reexamined.  There have been no changes to this history and physical examination.  Marcheta Grammes, DPM

## 2019-01-09 NOTE — Anesthesia Postprocedure Evaluation (Signed)
Anesthesia Post Note  Patient: Tammy Castaneda  Procedure(s) Performed: EXCISION OF ACCESSORY BONE RIGHT HALLUX (Right ) ARTHRODESIS INTERPHALANGEAL JOINT RIGHT HALLUX (Right ) GREEN WATERMANN BUNIONECTOMY RIGHT FOOT (Right ) HAMMER TOE CORRECTION 2ND TOE RIGHT FOOT (Right )  Patient location during evaluation: PACU Anesthesia Type: General Level of consciousness: awake and alert and oriented Pain management: pain level controlled Vital Signs Assessment: post-procedure vital signs reviewed and stable Respiratory status: spontaneous breathing Cardiovascular status: blood pressure returned to baseline and stable Postop Assessment: no apparent nausea or vomiting Anesthetic complications: no     Last Vitals:  Vitals:   01/09/19 1115 01/09/19 1135  BP: (!) 136/99 133/76  Pulse: 71 67  Resp: 16 16  Temp:  (!) 36.4 C  SpO2: 98% 94%    Last Pain:  Vitals:   01/09/19 1135  TempSrc: Oral  PainSc: 0-No pain                 Gerene Nedd

## 2019-01-09 NOTE — Op Note (Signed)
OPERATIVE NOTE  DATE OF PROCEDURE 01/09/2019  SURGEON Marcheta Grammes, DPM  ASSISTANT SURGEON Jilda Panda, DPM  OR STAFF Circulator: Jeanell Sparrow Fransisco Hertz, RN Relief Circulator: Edrick Kins, RN Relief Scrub: Lucie Leather, CST Scrub Person: Towanda Malkin, RN Vendor Representative : Georgiana Spinner   PREOPERATIVE DIAGNOSIS 1.  Hallux limitus, right foot 2.  Hallux valgus, right foot 3.  Hallux extensus, right foot 4.  Accessory bone, right hallux 5.  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  Pain, right foot  POSTOPERATIVE DIAGNOSIS Same  PROCEDURE 1.  Modified Waterman osteotomy of first metatarsal, right foot 2.  Excision of accessory bone, right hallux 3.  Arthrodesis of the interphalangeal joint, right hallux 4.  Hammertoe repair of the 2nd digit, right foot  ANESTHESIA Monitor Anesthesia Care   HEMOSTASIS Pneumatic ankle tourniquet set at 250 mmHg  ESTIMATED BLOOD LOSS Minimal (<5 cc)  MATERIALS USED Screw x 2 0.045" K-wire  INJECTABLES 0.5% Marcaine plain  PATHOLOGY Accessory bone, right hallux  COMPLICATIONS None  INDICATIONS:  Persistent right foot pain associated with bunion and hammertoe deformities nonresponsive to nonsurgical care.  DESCRIPTION OF THE PROCEDURE:  The patient was brought to the operating room and placed on the operative table in the supine position.  A pneumatic ankle tourniquet was placed about the patient's right ankle.  A timeout was performed.  The foot was anesthetized with 0.5% Marcaine plain.  The foot was scrubbed prepped and draped in the usual sterile manner.  A second timeout was performed.  The limb was elevated, exsanguinated and the pneumatic ankle tourniquet was inflated to 250 mmHg.  Attention was directed to the dorsal medial aspect of the right foot.  A linear longitudinal incision was made medial and parallel to the extensor hallucis longus tendon.  The dissection was continued deep down to the level of the  first metatarsophalangeal joint.  A linear longitudinal periosteal and capsular incision was performed.  The periosteal and capsular structures were reflected medially and laterally exposing the first metatarsal, proximal phalanx and distal phalanx.  The medial eminence of the first metatarsal head was resected using a power bone saw.  A portion of the dorsal first metatarsal was resected to improve first metatarsophalangeal joint range of motion.  Attention was redirected to the medial aspect of the first metatarsal head.  A chevron osteotomy was performed.  A second osteotomy was performed proximal and parallel to the dorsal wing of the chevron osteotomy to decompress the first metatarsophalangeal joint.  A small wafer of bone was removed.  The capital fragment was shifted laterally and impacted upon the first metatarsal shaft.  The osteotomy was fixated with an Acumed screw.  Position of the screw was confirmed with fluoroscopy.  Attention was directed to the dorsal aspect of the interphalangeal joint of the hallux.  The articular surface was resected from the head of the proximal and distal phalanges.  A wedge of bone with the base oriented medially was resected from the arthrodesis site to correct for the hallux interphalangeus.  Attention was directed to the plantar aspect of the interphalangeal joint.  An accessory ossicle was identified and removed.  Bone was sent to pathology for evaluation.  The flexor hallucis longus tendon was evaluated and found to be intact with no laceration.  The interphalangeal joint was fixated with Acumed 4.0 screw.  Adequate compression was achieved.  Position of the screw was confirmed with fluoroscopy.  The surgical wound was irrigated with copious amounts of  sterile irrigant.  The periosteal and capsular structures were reapproximated using 3-0 Vicryl.  The subcutaneous structures were reapproximated using 4-0 Vicryl.  The skin was reapproximated using 4-0  nylon.  Attention was directed to the dorsal aspect of the proximal interphalangeal joint of the second digit.  A transverse incision was made.  Dissection was continued deep down to the level of the proximal interphalangeal joint.  A transverse tenotomy and capsulotomy was performed.  The head of the proximal phalanx was resected using a power bone saw.  The toe was fixated using a 0.045 inch Kirschner wire.  The wire was bent, cut and capped.  The wound was irrigated with copious amounts of sterile irrigant.  The extensor tendon was reapproximated using 4-0 Vicryl.  The skin was reapproximated using 4-0 nylon.  Additional 0.5% Marcaine plain was injected for postoperative pain control..  A sterile compressive dressing was applied to the right foot.  The pneumatic ankle tourniquet was deflated and a prompt hyperemic response was noted to all digits of the right foot.  The patient tolerated the procedures well.  She was transferred from the operating room to the postanesthesia care unit with vital signs stable and vascular status intact to all digits of the left foot.

## 2019-01-09 NOTE — Transfer of Care (Signed)
Immediate Anesthesia Transfer of Care Note  Patient: Tammy Castaneda  Procedure(s) Performed: EXCISION OF ACCESSORY BONE RIGHT HALLUX (Right ) ARTHRODESIS INTERPHALANGEAL JOINT RIGHT HALLUX (Right ) GREEN WATERMANN BUNIONECTOMY RIGHT FOOT (Right ) HAMMER TOE CORRECTION 2ND TOE RIGHT FOOT (Right )  Patient Location: PACU  Anesthesia Type:MAC  Level of Consciousness: awake  Airway & Oxygen Therapy: Patient Spontanous Breathing  Post-op Assessment: Report given to RN  Post vital signs: Reviewed and stable  Last Vitals:  Vitals Value Taken Time  BP 148/131 01/09/19 1046  Temp    Pulse 75 01/09/19 1055  Resp 12 01/09/19 1055  SpO2 100 % 01/09/19 1055  Vitals shown include unvalidated device data.  Last Pain:  Vitals:   01/09/19 0744  TempSrc: Oral  PainSc: 6       Patients Stated Pain Goal: 8 (23/95/32 0233)  Complications: No apparent anesthesia complications

## 2019-01-09 NOTE — Discharge Instructions (Signed)
These instructions will give you an idea of what to expect after surgery and how to manage issues that may arise before your first post op office visit.  Pain Management Pain is best managed by staying ahead of it. If pain gets out of control, it is difficult to get it back under control. Local anesthesia that lasts 6-8 hours is used to numb the foot and decrease pain.  For the best pain control, take the pain medication every 4 hours for the first 2 days post op. On the third day pain medication can be taken as needed.   Post Op Nausea Nausea is common after surgery, so it is managed proactively.  If prescribed, use the prescribed nausea medication regularly for the first 2 days post op.  Bandages Do not worry if there is blood on the bandage. What looks like a lot of blood on the bandage is actually a small amount. Blood on the dressing spreads out as it is absorbed by the gauze, the same way a drop of water spreads out on a paper towel.  If the bandages feel wet or dry, stiff and uncomfortable, call the office during office hours and we will schedule a time for you to have the bandage changed.  Unless you are specifically told otherwise, we will do the first bandage change in the office.  Keep your bandage dry. If the bandage becomes wet or soiled, notify the office and we will schedule a time to change the bandage.  Activity It is best to spend most of the first 2 days after surgery lying down with the foot elevated above the level of your heart. You may put weight on your heel while wearing the CAM Walker (black boot).   You may only get up to go to the restroom.  Driving Do not drive until you are able to respond in an emergency (i.e. slam on the brakes). This usually occurs after the bone has healed - 6 to 8 weeks.  Call the Office If you have a fever over 101F.  If you have increasing pain after the initial post op pain has settled down.  If you have increasing redness, swelling,  or drainage.  If you have any questions or concerns.   Monitored Anesthesia Care, Care After These instructions provide you with information about caring for yourself after your procedure. Your health care provider may also give you more specific instructions. Your treatment has been planned according to current medical practices, but problems sometimes occur. Call your health care provider if you have any problems or questions after your procedure. What can I expect after the procedure? After your procedure, you may:  Feel sleepy for several hours.  Feel clumsy and have poor balance for several hours.  Feel forgetful about what happened after the procedure.  Have poor judgment for several hours.  Feel nauseous or vomit.  Have a sore throat if you had a breathing tube during the procedure. Follow these instructions at home: For at least 24 hours after the procedure:      Have a responsible adult stay with you. It is important to have someone help care for you until you are awake and alert.  Rest as needed.  Do not: ? Participate in activities in which you could fall or become injured. ? Drive. ? Use heavy machinery. ? Drink alcohol. ? Take sleeping pills or medicines that cause drowsiness. ? Make important decisions or sign legal documents. ? Take care of children  on your own. Eating and drinking  Follow the diet that is recommended by your health care provider.  If you vomit, drink water, juice, or soup when you can drink without vomiting.  Make sure you have little or no nausea before eating solid foods. General instructions  Take over-the-counter and prescription medicines only as told by your health care provider.  If you have sleep apnea, surgery and certain medicines can increase your risk for breathing problems. Follow instructions from your health care provider about wearing your sleep device: ? Anytime you are sleeping, including during daytime naps. ? While  taking prescription pain medicines, sleeping medicines, or medicines that make you drowsy.  If you smoke, do not smoke without supervision.  Keep all follow-up visits as told by your health care provider. This is important. Contact a health care provider if:  You keep feeling nauseous or you keep vomiting.  You feel light-headed.  You develop a rash.  You have a fever. Get help right away if:  You have trouble breathing. Summary  For several hours after your procedure, you may feel sleepy and have poor judgment.  Have a responsible adult stay with you for at least 24 hours or until you are awake and alert. This information is not intended to replace advice given to you by your health care provider. Make sure you discuss any questions you have with your health care provider. Document Released: 09/06/2015 Document Revised: 08/14/2017 Document Reviewed: 09/06/2015 Elsevier Patient Education  2020 [employer]

## 2019-01-09 NOTE — Anesthesia Preprocedure Evaluation (Signed)
Anesthesia Evaluation  Patient identified by MRN, date of birth, ID band Patient awake    Reviewed: Allergy & Precautions, NPO status , Patient's Chart, lab work & pertinent test results  Airway Mallampati: II  TM Distance: >3 FB Neck ROM: Full    Dental no notable dental hx. (+) Teeth Intact   Pulmonary shortness of breath, sleep apnea , COPD,  COPD inhaler, former smoker,    Pulmonary exam normal breath sounds clear to auscultation       Cardiovascular Exercise Tolerance: Good hypertension, Pt. on medications negative cardio ROS Normal cardiovascular examI Rhythm:Regular Rate:Normal     Neuro/Psych negative neurological ROS  negative psych ROS   GI/Hepatic Neg liver ROS, GERD  Medicated and Controlled,  Endo/Other  negative endocrine ROS  Renal/GU negative Renal ROS  negative genitourinary   Musculoskeletal negative musculoskeletal ROS (+)   Abdominal   Peds negative pediatric ROS (+)  Hematology negative hematology ROS (+)   Anesthesia Other Findings   Reproductive/Obstetrics negative OB ROS                             Anesthesia Physical Anesthesia Plan  ASA: III  Anesthesia Plan: General   Post-op Pain Management:    Induction: Intravenous  PONV Risk Score and Plan: 3 and Ondansetron, Dexamethasone, Treatment may vary due to age or medical condition, Propofol infusion, TIVA and Midazolam  Airway Management Planned: Nasal Cannula and Simple Face Mask  Additional Equipment:   Intra-op Plan:   Post-operative Plan:   Informed Consent: I have reviewed the patients History and Physical, chart, labs and discussed the procedure including the risks, benefits and alternatives for the proposed anesthesia with the patient or authorized representative who has indicated his/her understanding and acceptance.     Dental advisory given  Plan Discussed with: CRNA  Anesthesia Plan  Comments: (Plan Full PPE use  Plan MAC with GA vs GETA as needed )        Anesthesia Quick Evaluation

## 2019-01-10 ENCOUNTER — Encounter (HOSPITAL_COMMUNITY): Payer: Self-pay | Admitting: Podiatry

## 2019-01-10 NOTE — Addendum Note (Signed)
Addendum  created 01/10/19 1056 by Mickel Baas, CRNA   Charge Capture section accepted

## 2021-09-15 ENCOUNTER — Other Ambulatory Visit: Payer: Self-pay | Admitting: Podiatry

## 2021-10-05 NOTE — Patient Instructions (Signed)
? ? ? ? ? ? ? ? Tammy Castaneda ? 10/05/2021  ?  ? @PREFPERIOPPHARMACY @ ? ? Your procedure is scheduled on  10/08/2021. ? ? [facility]  [facility] A.M. ? ? Call this number if you have problems the morning of surgery: ? [phone] ? ? Remember: ? Do not eat or drink after midnight. ?  ? ?  Use your inhaler before you come and bring your rescue inhaler with you. ?  ? ? Take these medicines the morning of surgery with A SIP OF WATER  ? ?             allopurinol, carvedilol, gabapentin, prilosec. ?  ? ? Do not wear jewelry, make-up or nail polish. ? Do not wear lotions, powders, or perfumes, or deodorant. ? Do not shave 48 hours prior to surgery.  Men may shave face and neck. ? Do not bring valuables to the hospital. ? [facility] is not responsible for any belongings or valuables. ? ?Contacts, dentures or bridgework may not be worn into surgery.  Leave your suitcase in the car.  After surgery it may be brought to your room. ? ?For patients admitted to the hospital, discharge time will be determined by your treatment team. ? ?Patients discharged the day of surgery will not be allowed to drive home and must have someone with them for 24 hours.  ? ? ?Special instructions:   DO NOT smoke tobacco or vape for 24 hours before your procedure. ? ?Please read over the following fact sheets that you were given. ?Coughing and Deep Breathing, Surgical Site Infection Prevention, Anesthesia Post-op Instructions, and Care and Recovery After Surgery ?  ? ? ? Bunion Surgery, Care After ?This sheet gives you information about how to care for yourself after your procedure. Your health care provider may also give you more specific instructions. If you have problems or questions, contact your health care provider. ?What can I expect after the procedure? ?After the procedure, it is common to have: ?Redness. ?Pain. ?Swelling. ?A small amount of fluid coming from your incision. ?Follow these instructions at home: ?If you have a  post-operative brace, boot, or shoe: ?Wear the post-op (post-operative) brace, boot, or shoe as told by your health care provider. Remove it only as told by your health care provider. ?Loosen the brace, boot, or shoe if your toes tingle, become numb, or turn cold and blue. ?Keep the brace, boot, or shoe clean and dry. ?If you have a cast: ?Do not put pressure on any part of the cast until it is fully hardened. This may take several hours. ?Do not stick anything inside the cast to scratch your skin. Doing that increases your risk of infection. ?Check the skin around the cast every day. Tell your health care provider about any concerns. ?You may put lotion on dry skin around the edges of the cast. Do not put lotion on the skin underneath the cast. ?Keep the cast clean and dry. ?Bathing ?Do not take baths, swim, or use a hot tub until your health care provider approves. Ask your health care provider if you may take showers. ?If your brace, boot, shoe, or cast is not waterproof: ?Do not let it get wet. ?Cover it with a watertight covering when you take a bath or a shower. ?Keep your bandage (dressing) dry until your health care provider says it can be removed. ?Incision care ? ?The dressing holds your toe in the correct position. Do not  change the dressing until your health care provider approves. ?Follow instructions from your health care provider about how to take care of your incision. Make sure you: ?Wash your hands with soap and water for at least 20 seconds before and after you change your dressing. If soap and water are not available, use hand sanitizer. ?Change your dressing as told by your health care provider. ?Leave stitches (sutures), skin glue, or adhesive strips in place. These skin closures may need to stay in place for 2 weeks or longer. If adhesive strip edges start to loosen and curl up, you may trim the loose edges. Do not remove adhesive strips completely unless your health care provider tells you to  do that. ?Check your incision area every day for signs of infection. Check for: ?More redness, swelling, or pain. ?Blood or more fluid. ?Warmth. ?Pus or a bad smell. ?Managing pain, stiffness, and swelling ? ?If directed, put ice on the affected area. To do this: ?If you have a removable brace, boot, or shoe, remove it as told by your health care provider. ?Put ice in a plastic bag. ?Place a towel between your skin and the bag or between your cast and the bag. ?Leave the ice on for 20 minutes, 2-3 times a day. ?Remove the ice if your skin turns bright red. This is very important. If you cannot feel pain, heat, or cold, you have a greater risk of damage to the area. ?Move your toes often to reduce stiffness and swelling. ?Raise (elevate) the injured area above the level of your heart while you are sitting or lying down. ?Activity ?Return to your normal activities as told by your health care provider. Ask your health care provider what activities are safe for you. ?If physical therapy was prescribed, do exercises as told by your health care provider. ?Driving ?If you were given a sedative during the procedure, it can affect you for several hours. Do not drive or operate machinery until your health care provider says that it is safe. ?Ask your health care provider when it is safe to drive if you have a brace, boot, shoe, or cast on your foot. ?Safety ?Do not use the affected leg to support (bear) your body weight until your health care provider says that you can. Follow weight-bearing restrictions as told. Use crutches, a cane, or a walker as told by your health care provider. ?General instructions ?Do not use any products that contain nicotine or tobacco, such as cigarettes, e-cigarettes, and chewing tobacco. If you need help quitting, ask your health care provider. ?Take over-the-counter and prescription medicines only as told by your health care provider. ?Your medicines may cause constipation. To prevent or treat  constipation, you may need to: ?Drink enough fluid to keep your urine pale yellow. ?Take over-the-counter or prescription medicines. ?Eat foods that are high in fiber, such as beans, whole grains, and fresh fruits and vegetables. ?Limit foods that are high in fat and processed sugars, such as fried or sweet foods. ?Do not wear high heels or tight-fitting shoes, even after you heal. ?Keep all follow-up visits. This is important. ?Contact a health care provider if: ?You have any of these signs of infection: ?More redness, swelling, or pain around your incision. ?Blood or more fluid coming from your incision. ?Warmth coming from your incision. ?Pus or a bad smell coming from your incision. ?A fever or chills. ?Your dressing gets wet or it falls off. ?You have swelling in your lower leg or calf. ?You  have numbness or stiffness in your toes. ?Get help right away if: ?You have severe pain. ?You have shortness of breath or trouble breathing. ?You have chest pain. ?You have redness, swelling, pain, or warmth in your calf or leg. ?These symptoms may represent a serious problem that is an emergency. Do not wait to see if the symptoms will go away. Get medical help right away. Call your local emergency services (911 in the [place] Do not drive yourself to the hospital. ?Summary ?If directed, put ice on the affected area. Leave the ice on for 20 minutes, 2-3 times a day. ?Ask your health care provider when it is safe to drive if you have a brace, boot, shoe, or cast on your foot. ?Do not use the affected leg to support (bear) your body weight until your health care provider says that you can. Follow weight-bearing restrictions as told. Use crutches, a cane, or a walker as told by your health care provider. ?This information is not intended to replace advice given to you by your health care provider. Make sure you discuss any questions you have with your health care provider. ?Document Revised: 09/20/2019 Document Reviewed:  09/20/2019 ?Elsevier Patient Education ? 2023 Elsevier Inc[employer] ?Monitored Anesthesia Care, Care After ?This sheet gives you information about how to care for yourself after your procedure. Your health care provider ma

## 2021-10-06 ENCOUNTER — Other Ambulatory Visit (HOSPITAL_COMMUNITY): Payer: Self-pay | Admitting: Podiatry

## 2021-10-06 ENCOUNTER — Encounter (HOSPITAL_COMMUNITY)
Admission: RE | Admit: 2021-10-06 | Discharge: 2021-10-06 | Disposition: A | Discharge status: Home / Self Care | Payer: Medicare Other | Source: Ambulatory Visit | Attending: Podiatry | Admitting: Podiatry

## 2021-10-06 ENCOUNTER — Ambulatory Visit (HOSPITAL_COMMUNITY)
Admission: RE | Admit: 2021-10-06 | Discharge: 2021-10-06 | Disposition: A | Discharge status: Home / Self Care | Payer: Medicare Other | Source: Ambulatory Visit | Attending: Podiatry | Admitting: Podiatry

## 2021-10-06 ENCOUNTER — Encounter (HOSPITAL_COMMUNITY): Payer: Self-pay

## 2021-10-06 VITALS — BP 139/69 | HR 59 | Temp 98.7°F | Resp 18 | Ht 65.0 in | Wt 179.0 lb

## 2021-10-06 DIAGNOSIS — Z79899 Other long term (current) drug therapy: Secondary | ICD-10-CM

## 2021-10-06 DIAGNOSIS — I10 Essential (primary) hypertension: Secondary | ICD-10-CM

## 2021-10-06 DIAGNOSIS — Z01810 Encounter for preprocedural cardiovascular examination: Secondary | ICD-10-CM | POA: Insufficient documentation

## 2021-10-06 DIAGNOSIS — M2012 Hallux valgus (acquired), left foot: Secondary | ICD-10-CM | POA: Insufficient documentation

## 2021-10-08 ENCOUNTER — Ambulatory Visit (HOSPITAL_COMMUNITY): Payer: Medicare Other | Admitting: Anesthesiology

## 2021-10-08 ENCOUNTER — Other Ambulatory Visit: Payer: Self-pay

## 2021-10-08 ENCOUNTER — Ambulatory Visit (HOSPITAL_BASED_OUTPATIENT_CLINIC_OR_DEPARTMENT_OTHER): Payer: Medicare Other | Admitting: Anesthesiology

## 2021-10-08 ENCOUNTER — Encounter (HOSPITAL_COMMUNITY): Payer: Self-pay | Admitting: Podiatry

## 2021-10-08 ENCOUNTER — Ambulatory Visit (HOSPITAL_COMMUNITY): Payer: Medicare Other

## 2021-10-08 ENCOUNTER — Encounter (HOSPITAL_COMMUNITY)
Admission: RE | Disposition: A | Discharge status: Home / Self Care | Payer: Self-pay | Source: Home / Self Care | Attending: Podiatry

## 2021-10-08 ENCOUNTER — Ambulatory Visit (HOSPITAL_COMMUNITY)
Admission: RE | Admit: 2021-10-08 | Discharge: 2021-10-08 | Disposition: A | Discharge status: Home / Self Care | Payer: Medicare Other | Attending: Podiatry | Admitting: Podiatry

## 2021-10-08 DIAGNOSIS — M2012 Hallux valgus (acquired), left foot: Secondary | ICD-10-CM

## 2021-10-08 DIAGNOSIS — K219 Gastro-esophageal reflux disease without esophagitis: Secondary | ICD-10-CM | POA: Insufficient documentation

## 2021-10-08 DIAGNOSIS — G4730 Sleep apnea, unspecified: Secondary | ICD-10-CM

## 2021-10-08 DIAGNOSIS — J449 Chronic obstructive pulmonary disease, unspecified: Secondary | ICD-10-CM | POA: Diagnosis not present

## 2021-10-08 DIAGNOSIS — Z87891 Personal history of nicotine dependence: Secondary | ICD-10-CM | POA: Insufficient documentation

## 2021-10-08 DIAGNOSIS — I10 Essential (primary) hypertension: Secondary | ICD-10-CM

## 2021-10-08 HISTORY — PX: BUNIONECTOMY: SHX129

## 2021-10-08 SURGERY — BUNIONECTOMY
Anesthesia: General | Site: Toe | Laterality: Left

## 2021-10-08 MED ORDER — BUPIVACAINE HCL (PF) 0.5 % IJ SOLN
INTRAMUSCULAR | Status: DC | PRN
  Administered 2021-10-08: 20 mL

## 2021-10-08 MED ORDER — PROPOFOL 10 MG/ML IV BOLUS
INTRAVENOUS | Status: AC
  Filled 2021-10-08: qty 20

## 2021-10-08 MED ORDER — FENTANYL CITRATE PF 50 MCG/ML IJ SOSY
PREFILLED_SYRINGE | INTRAMUSCULAR | Status: AC
  Filled 2021-10-08: qty 1

## 2021-10-08 MED ORDER — PROPOFOL 500 MG/50ML IV EMUL
INTRAVENOUS | Status: DC | PRN
Start: 2021-10-08 — End: 2021-10-08
  Administered 2021-10-08: 50 ug/kg/min via INTRAVENOUS
  Administered 2021-10-08: 40 ug/kg/min via INTRAVENOUS

## 2021-10-08 MED ORDER — LIDOCAINE HCL (PF) 1 % IJ SOLN
INTRAMUSCULAR | Status: AC
  Filled 2021-10-08: qty 30

## 2021-10-08 MED ORDER — BUPIVACAINE HCL (PF) 0.5 % IJ SOLN
INTRAMUSCULAR | Status: AC
  Filled 2021-10-08: qty 30

## 2021-10-08 MED ORDER — LIDOCAINE HCL (PF) 2 % IJ SOLN
INTRAMUSCULAR | Status: AC
  Filled 2021-10-08: qty 5

## 2021-10-08 MED ORDER — PROPOFOL 10 MG/ML IV BOLUS
INTRAVENOUS | Status: DC | PRN
  Administered 2021-10-08 (×2): 20 mg via INTRAVENOUS
  Administered 2021-10-08: 30 mg via INTRAVENOUS
  Administered 2021-10-08: 100 mg via INTRAVENOUS

## 2021-10-08 MED ORDER — CHLORHEXIDINE GLUCONATE 0.12 % MT SOLN: 15.0000 mL | Freq: Once | OROMUCOSAL | Status: DC

## 2021-10-08 MED ORDER — ONDANSETRON HCL 4 MG/2ML IJ SOLN
INTRAMUSCULAR | Status: DC | PRN
  Administered 2021-10-08: 4 mg via INTRAVENOUS

## 2021-10-08 MED ORDER — FENTANYL CITRATE (PF) 100 MCG/2ML IJ SOLN
INTRAMUSCULAR | Status: DC | PRN
  Administered 2021-10-08: 25 ug via INTRAVENOUS
  Administered 2021-10-08: 50 ug via INTRAVENOUS
  Administered 2021-10-08: 25 ug via INTRAVENOUS

## 2021-10-08 MED ORDER — LACTATED RINGERS IV SOLN: INTRAVENOUS | Status: DC

## 2021-10-08 MED ORDER — ORAL CARE MOUTH RINSE: 15.0000 mL | Freq: Once | OROMUCOSAL | Status: DC

## 2021-10-08 MED ORDER — ONDANSETRON HCL 4 MG/2ML IJ SOLN: 4.0000 mg | Freq: Once | INTRAMUSCULAR | Status: DC | PRN

## 2021-10-08 MED ORDER — LIDOCAINE HCL (PF) 1 % IJ SOLN
INTRAMUSCULAR | Status: DC | PRN
  Administered 2021-10-08: 10 mL

## 2021-10-08 MED ORDER — FENTANYL CITRATE (PF) 100 MCG/2ML IJ SOLN
INTRAMUSCULAR | Status: AC
  Filled 2021-10-08: qty 2

## 2021-10-08 MED ORDER — FENTANYL CITRATE PF 50 MCG/ML IJ SOSY
25.0000 ug | PREFILLED_SYRINGE | INTRAMUSCULAR | Status: DC | PRN
  Administered 2021-10-08: 50 ug via INTRAVENOUS

## 2021-10-08 MED ORDER — LIDOCAINE HCL (CARDIAC) PF 50 MG/5ML IV SOSY
PREFILLED_SYRINGE | INTRAVENOUS | Status: DC | PRN
Start: 2021-10-08 — End: 2021-10-08
  Administered 2021-10-08: 25 mg via INTRAVENOUS

## 2021-10-08 MED ORDER — VANCOMYCIN HCL IN DEXTROSE 1-5 GM/200ML-% IV SOLN
INTRAVENOUS | Status: AC
  Filled 2021-10-08: qty 200

## 2021-10-08 MED ORDER — VANCOMYCIN HCL 1000 MG IV SOLR
INTRAVENOUS | Status: DC | PRN
  Administered 2021-10-08: 1000 mg via INTRAVENOUS

## 2021-10-08 MED ORDER — SODIUM CHLORIDE 0.9 % IR SOLN
Status: DC | PRN
  Administered 2021-10-08: 1000 mL

## 2021-10-08 SURGICAL SUPPLY — 53 items
APL PRP STRL LF DISP 70% ISPRP (MISCELLANEOUS) ×1
APL SKNCLS STERI-STRIP NONHPOA (GAUZE/BANDAGES/DRESSINGS) ×1
BANDAGE ESMARK 4X12 BL STRL LF (DISPOSABLE) ×1 IMPLANT
BENZOIN TINCTURE PRP APPL 2/3 (GAUZE/BANDAGES/DRESSINGS) ×2 IMPLANT
BLADE AVERAGE 25X9 (BLADE) ×1 IMPLANT
BLADE SURG 15 STRL LF DISP TIS (BLADE) ×2 IMPLANT
BLADE SURG 15 STRL SS (BLADE) ×2
BNDG CMPR 12X4 ELC STRL LF (DISPOSABLE) ×1
BNDG CMPR STD VLCR NS LF 5.8X4 (GAUZE/BANDAGES/DRESSINGS) ×1
BNDG CONFORM 2 STRL LF (GAUZE/BANDAGES/DRESSINGS) ×2 IMPLANT
BNDG ELASTIC 4X5.8 VLCR NS LF (GAUZE/BANDAGES/DRESSINGS) ×2 IMPLANT
BNDG ESMARK 4X12 BLUE STRL LF (DISPOSABLE) ×2
BNDG GAUZE ELAST 4 BULKY (GAUZE/BANDAGES/DRESSINGS) ×2 IMPLANT
CHLORAPREP W/TINT 26 (MISCELLANEOUS) ×2 IMPLANT
CLOTH BEACON ORANGE TIMEOUT ST (SAFETY) ×2 IMPLANT
COVER LIGHT HANDLE STERIS (MISCELLANEOUS) ×4 IMPLANT
CUFF TOURN SGL QUICK 18X4 (TOURNIQUET CUFF) ×1 IMPLANT
DECANTER SPIKE VIAL GLASS SM (MISCELLANEOUS) ×2 IMPLANT
DRAPE C-ARM FOLDED MOBILE STRL (DRAPES) ×1 IMPLANT
DRSG ADAPTIC 3X8 NADH LF (GAUZE/BANDAGES/DRESSINGS) ×2 IMPLANT
ELECT REM PT RETURN 9FT ADLT (ELECTROSURGICAL) ×2
ELECTRODE REM PT RTRN 9FT ADLT (ELECTROSURGICAL) ×1 IMPLANT
GAUZE SPONGE 4X4 12PLY STRL (GAUZE/BANDAGES/DRESSINGS) ×2 IMPLANT
GLOVE BIO SURGEON STRL SZ7.5 (GLOVE) ×2 IMPLANT
GLOVE BIOGEL PI IND STRL 7.0 (GLOVE) ×2 IMPLANT
GLOVE BIOGEL PI INDICATOR 7.0 (GLOVE) ×4
GLOVE ECLIPSE 6.5 STRL STRAW (GLOVE) ×2 IMPLANT
GOWN STRL REUS W/ TWL LRG LVL3 (GOWN DISPOSABLE) ×1 IMPLANT
GOWN STRL REUS W/TWL LRG LVL3 (GOWN DISPOSABLE) ×6 IMPLANT
KIT DRILL MINI BUNION (DRILL) ×1 IMPLANT
KIT INSTRUMENT DYNAFORCE PLATE (KITS) ×2 IMPLANT
KIT INSTRUMENT MINI BUNION (INSTRUMENTS) ×1 IMPLANT
KIT TURNOVER KIT A (KITS) ×2 IMPLANT
MANIFOLD NEPTUNE II (INSTRUMENTS) ×2 IMPLANT
NDL HYPO 27GX1-1/4 (NEEDLE) ×3 IMPLANT
NEEDLE HYPO 27GX1-1/4 (NEEDLE) ×6 IMPLANT
NS IRRIG 1000ML POUR BTL (IV SOLUTION) ×2 IMPLANT
PACK BASIC LIMB (CUSTOM PROCEDURE TRAY) ×2 IMPLANT
PAD ARMBOARD 7.5X6 YLW CONV (MISCELLANEOUS) ×2 IMPLANT
PLATE MINI BUNION OFFSET 3.5 (Plate) ×1 IMPLANT
PLATE OFFSET SHRT 4.5 (Plate) ×1 IMPLANT
RASP SM TEAR CROSS CUT (RASP) ×1 IMPLANT
SCREW MINI BUNION LOC 3.0X20 (Screw) ×1 IMPLANT
SCREW NON-LOCKING 2.7X18 (Screw) ×1 IMPLANT
SET BASIN LINEN APH (SET/KITS/TRAYS/PACK) ×2 IMPLANT
SPONGE T-LAP 18X18 ~~LOC~~+RFID (SPONGE) ×2 IMPLANT
STAPLE BONE FIXATION 10X10 (Staple) ×1 IMPLANT
STRIP CLOSURE SKIN 1/2X4 (GAUZE/BANDAGES/DRESSINGS) ×1 IMPLANT
SUT PROLENE 4 0 PS 2 18 (SUTURE) ×1 IMPLANT
SUT VIC AB 2-0 CT2 27 (SUTURE) ×2 IMPLANT
SUT VIC AB 4-0 PS2 27 (SUTURE) ×2 IMPLANT
SUT VICRYL AB 3-0 FS1 BRD 27IN (SUTURE) ×2 IMPLANT
SYR CONTROL 10ML LL (SYRINGE) ×5 IMPLANT

## 2021-10-08 NOTE — Discharge Instructions (Signed)
These instructions will give you an idea of what to expect after surgery and how to manage issues that may arise before your first post op office visit.  Pain Management Pain is best managed by "staying ahead" of it. If pain gets out of control, it is difficult to get it back under control. Local anesthesia that lasts 6-8 hours is used to numb the foot and decrease pain.  For the best pain control, take the pain medication every 4 hours for the first 2 days post op. On the third day pain medication can be taken as needed.   Post Op Nausea Nausea is common after surgery, so it is managed proactively.  If prescribed, use the prescribed nausea medication regularly for the first 2 days post op.  Bandages Do not worry if there is blood on the bandage. What looks like a lot of blood on the bandage is actually a small amount. Blood on the dressing spreads out as it is absorbed by the gauze, the same way a drop of water spreads out on a paper towel.  If the bandages feel wet or dry, stiff and uncomfortable, call the office during office hours and we will schedule a time for you to have the bandage changed.  Unless you are specifically told otherwise, we will do the first bandage change in the office.  Keep your bandage dry. If the bandage becomes wet or soiled, notify the office and we will schedule a time to change the bandage.  Activity It is best to spend most of the first 2 days after surgery lying down with the foot elevated above the level of your heart. You may put weight on your heel while wearing the CAM Walker (black boot).   You may only get up to go to the restroom.  Driving Do not drive until you are able to respond in an emergency (i.e. slam on the brakes). This usually occurs after the bone has healed - 6 to 8 weeks.  Call the Office If you have a fever over 101F.  If you have increasing pain after the initial post op pain has settled down.  If you have increasing redness, swelling,  or drainage.  If you have any questions or concerns.   

## 2021-10-08 NOTE — Anesthesia Postprocedure Evaluation (Signed)
Anesthesia Post Note ? ?Patient: Tammy Castaneda ? ?Procedure(s) Performed: BUNIONECTOMY (Left: Toe) ? ?Patient location during evaluation: Phase II ?Anesthesia Type: General ?Level of consciousness: awake ?Pain management: pain level controlled ?Vital Signs Assessment: post-procedure vital signs reviewed and stable ?Respiratory status: spontaneous breathing and respiratory function stable ?Cardiovascular status: blood pressure returned to baseline and stable ?Postop Assessment: no headache and no apparent nausea or vomiting ?Anesthetic complications: no ?Comments: Late entry ? ? ?No notable events documented. ? ? ?Last Vitals:  ?Vitals:  ? 10/08/21 1045 10/08/21 1122  ?BP: 98/61 118/66  ?Pulse: (!) 59 62  ?Resp: 10 18  ?Temp:  36.6 ?C  ?SpO2: 96% 98%  ?  ?Last Pain:  ?Vitals:  ? 10/08/21 1122  ?TempSrc: Oral  ?PainSc: 0-No pain  ? ? ?  ?  ?  ?  ?  ?  ? ?[place] ? ? ? ? ?

## 2021-10-08 NOTE — Brief Op Note (Signed)
BRIEF OPERATIVE NOTE ? ?DATE OF PROCEDURE ?10/08/2021 ? ?SURGEON ?Dallas Schimke, DPM ? ?ASSISTANT SURGEON ?None ? ?OR STAFF ?Circulator: Teofilo Pod, RN ?Radiology Technologist: Rich Number ?Scrub Person: Cox, Wyatt Haste, RN; Jonell Cluck, CST ?Vendor Representative : Alleen Borne ?Circulator Assistant: Lewayne Bunting, RN ?  ?PREOPERATIVE DIAGNOSIS ?Hallux valgus, left foot ?Pain, left foot ? ?POSTOPERATIVE DIAGNOSIS ?Same ? ?PROCEDURE ?Bunionectomy, left foot ?Akin osteotomy of hallux, left foot ? ?ANESTHESIA ?Monitor Anesthesia Care  ? ?HEMOSTASIS ?Pneumatic ankle tourniquet set at 250 mmHg ? ?ESTIMATED BLOOD LOSS ?Minimal (<5 cc) ? ?MATERIALS USED ?Crossroads MiniBunion plate and corresponding screws ?Crossroads bone staple ? ?INJECTABLES ?0.5% Marcaine plain ?1% Lidocaine plain ? ?PATHOLOGY ?None ? ?COMPLICATIONS ?None ? ?

## 2021-10-08 NOTE — Anesthesia Preprocedure Evaluation (Signed)
Anesthesia Evaluation  ?Patient identified by MRN, date of birth, ID band ?Patient awake ? ? ? ?Reviewed: ?Allergy & Precautions, H&P , NPO status , Patient's Chart, lab work & pertinent test results, reviewed documented beta blocker date and time  ? ?Airway ?Mallampati: II ? ?TM Distance: >3 FB ?Neck ROM: full ? ? ? Dental ?no notable dental hx. ? ?  ?Pulmonary ?sleep apnea , COPD, former smoker,  ?  ?Pulmonary exam normal ?breath sounds clear to auscultation ? ? ? ? ? ? Cardiovascular ?Exercise Tolerance: Good ?hypertension, negative cardio ROS ? ? ?Rhythm:regular Rate:Normal ? ? ?  ?Neuro/Psych ?negative neurological ROS ? negative psych ROS  ? GI/Hepatic ?Neg liver ROS, GERD  Medicated,  ?Endo/Other  ?negative endocrine ROS ? Renal/GU ?negative Renal ROS  ?negative genitourinary ?  ?Musculoskeletal ? ? Abdominal ?  ?Peds ? Hematology ?negative hematology ROS ?(+)   ?Anesthesia Other Findings ? ? Reproductive/Obstetrics ?negative OB ROS ? ?  ? ? ? ? ? ? ? ? ? ? ? ? ? ?  ?  ? ? ? ? ? ? ? ? ?Anesthesia Physical ?Anesthesia Plan ? ?ASA: 3 ? ?Anesthesia Plan: General  ? ?Post-op Pain Management:   ? ?Induction:  ? ?PONV Risk Score and Plan: Propofol infusion ? ?Airway Management Planned:  ? ?Additional Equipment:  ? ?Intra-op Plan:  ? ?Post-operative Plan:  ? ?Informed Consent: I have reviewed the patients History and Physical, chart, labs and discussed the procedure including the risks, benefits and alternatives for the proposed anesthesia with the patient or authorized representative who has indicated his/her understanding and acceptance.  ? ? ? ?Dental Advisory Given ? ?Plan Discussed with: CRNA ? ?Anesthesia Plan Comments:   ? ? ? ? ? ? ?Anesthesia Quick Evaluation ? ?

## 2021-10-08 NOTE — H&P (Signed)
HISTORY AND PHYSICAL INTERVAL NOTE: ? ?10/08/2021 ? ?7:43 AM ? ?Tammy Castaneda  has presented today for surgery, with the diagnosis of hallux valgus left foot.  The various methods of treatment have been discussed with the patient.  No guarantees were given.  After consideration of risks, benefits and other options for treatment, the patient has consented to surgery.  I have reviewed the patients? chart and labs.   ? ?Patient Vitals for the past 24 hrs: ? BP Temp Temp src Pulse Resp SpO2  ?10/08/21 0629 (!) 133/56 98.1 ?F (36.7 ?C) Oral (!) 54 16 100 %  ? ? ?A history and physical examination was performed in my office.  The patient was reexamined.  There have been no changes to this history and physical examination. ? ?Tammy Castaneda, DPM ? ? ? ? ? ? ? ?

## 2021-10-08 NOTE — Anesthesia Procedure Notes (Signed)
Date/Time: 10/08/2021 8:00 AM ?Performed by: Franco Nones, CRNA ?Pre-anesthesia Checklist: Patient identified, Emergency Drugs available, Suction available, Timeout performed and Patient being monitored ?Patient Re-evaluated:Patient Re-evaluated prior to induction ?Oxygen Delivery Method: Nasal Cannula ? ? ? ? ?

## 2021-10-08 NOTE — Op Note (Signed)
OPERATIVE NOTE  DATE OF PROCEDURE 10/08/2021  SURGEON Dallas Schimke, DPM  ASSISTANT SURGEON None  OR STAFF Circulator: Teofilo Pod, RN Radiology Technologist: Rich Number Scrub Person: Cox, Wyatt Haste, RN; Jonell Cluck, CST Vendor Representative : Alleen Borne Circulator Assistant: Lewayne Bunting, RN   PREOPERATIVE DIAGNOSIS Hallux valgus, left foot Pain, left foot  POSTOPERATIVE DIAGNOSIS Same  PROCEDURE Bunionectomy, left foot Akin osteotomy of hallux, left foot  ANESTHESIA Monitor Anesthesia Care   HEMOSTASIS Pneumatic ankle tourniquet set at 250 mmHg  ESTIMATED BLOOD LOSS Minimal (<5 cc)  MATERIALS USED Crossroads MiniBunion plate and corresponding screws Crossroads bone staple  INJECTABLES 0.5% Marcaine plain 1% Lidocaine plain  PATHOLOGY None  COMPLICATIONS None  INDICATIONS:  Painful bunion deformity of the left foot nonresponsive to nonsurgical care including alterations in shoe gear.  DESCRIPTION OF THE PROCEDURE:  The patient was brought to the operating room and placed on the operative table in the supine position.  A pneumatic ankle tourniquet was placed about the patient's left ankle.  A timeout was performed.  The foot was anesthetized using 0.5% Marcaine plain.  The foot was scrubbed, prepped and draped in the usual sterile fashion.  A second timeout was performed.  The foot was elevated, exsanguinated and the pneumatic ankle tourniquet inflated to 250 mmHg.  Attention was directed to the medial aspect of the left first metatarsal.  The desired site of the osteotomy was determined under C-arm fluoroscopy.  A 3 cm incision was made over the site of the planned osteotomy.  Dissection was continued deep down to the level of the first metatarsal bone.  1% Lidocaine plain was injected at the operative site.  A through and through osteotomy was performed proximal to the distal metaphyseal flare of the first metatarsal.  The  capital fragment was shifted laterally and fixated using a Crossroads MiniBunion plate with corresponding screws.  Correction of the deformity was assessed.  Additional correction was desired.  The plate was removed and a thicker plate was inserted to allow for more lateral transelation of the capital fragment.  The plate was fixated with corresponding screws.  Correction of the deformity was assessed and found to be acceptable.  Persistent lateral deviation of the hallux was noted.  It was determined that an Aiken osteotomy would be required to correct the persistent lateral deviation of the hallux.  A linear longitudinal incision was made along the dorsal aspect of the base of the proximal phalanx.  The incision was made medial and parallel to the extensor hallucis longus tendon.  Dissection was continued deep down to the level of the proximal phalanx.  A linear periosteal incision was performed.  The periosteum was reflected medially and laterally exposing the proximal phalanx.  A wedge-shaped osteotomy was performed the proximal aspect of the proximal phalanx.  The apex was oriented laterally and the base medially.  The wedge of bone was removed and the lateral hinge was feathered to allow for adequate closure of the osteotomy.  Failure of the lateral hinge was noted.  The osteotomy was reduced and fixated using a Crossroads bone staple.  Correction of the deformity and position of the hardware was evaluated under C-arm fluoroscopy and found to be acceptable.  Both surgical wounds were irrigated with copious amounts of sterile irrigant.  The subcutaneous structures were reapproximated using 4-0 Vicryl.  The skin was reapproximated using 4-0 Vicryl in a running subcuticular manner.  Wound closure was reinforced with Steri-Strips.  A  sterile compressive dressing was applied to the left foot.  The pneumatic ankle tourniquet was deflated and a prompt hyperemic response was noted to all digits of the left  foot.  The patient was brought to the operating room and placed on the operative table in the supine position.  A pneumatic ankle tourniquet was applied to the operative extremity.  Following sedation, the surgical site was anesthetized with 0.5% Marcaine plain.  The foot was then prepped, scrubbed, and draped in the usual sterile technique.  The foot was elevated, exsanguinated and the pneumatic ankle tourniquet inflated to 250 mmHg.     The patient tolerated the procedure well.  The patient was then transferred to PACU with vital signs stable and vascular status intact to all toes of the operative foot.

## 2021-10-08 NOTE — Transfer of Care (Signed)
Immediate Anesthesia Transfer of Care Note ? ?Patient: Tammy Castaneda ? ?Procedure(s) Performed: BUNIONECTOMY (Left: Toe) ? ?Patient Location: PACU ? ?Anesthesia Type:General ? ?Level of Consciousness: awake and patient cooperative ? ?Airway & Oxygen Therapy: Patient Spontanous Breathing ? ?Post-op Assessment: Report given to RN and Post -op Vital signs reviewed and stable ? ?Post vital signs: Reviewed and stable ? ?Last Vitals:  ?Vitals Value Taken Time  ?BP 91/49 0954  ?Temp 97.7 0954  ?Pulse 68 10/08/21 0954  ?Resp 15 10/08/21 0954  ?SpO2 98 % 10/08/21 0954  ?Vitals shown include unvalidated device data. ? ?Last Pain:  ?Vitals:  ? 10/08/21 0629  ?TempSrc: Oral  ?PainSc: 0-No pain  ?   ? ?  ? ?Complications: No notable events documented. ?

## 2021-10-11 ENCOUNTER — Encounter (HOSPITAL_COMMUNITY): Payer: Self-pay | Admitting: Podiatry

## 2022-08-04 IMAGING — RF DG FOOT 2V*L*
1 series · 4 of 4 positions shown · non-contrast
Comparison: Left foot radiographs 10/06/2021

CLINICAL DATA: Fluoroscopy for left bunionectomy repair.

EXAM:
LEFT FOOT - 2 VIEW

[Series 1: run · 4 of 4 slices shown]
[im 1/4]
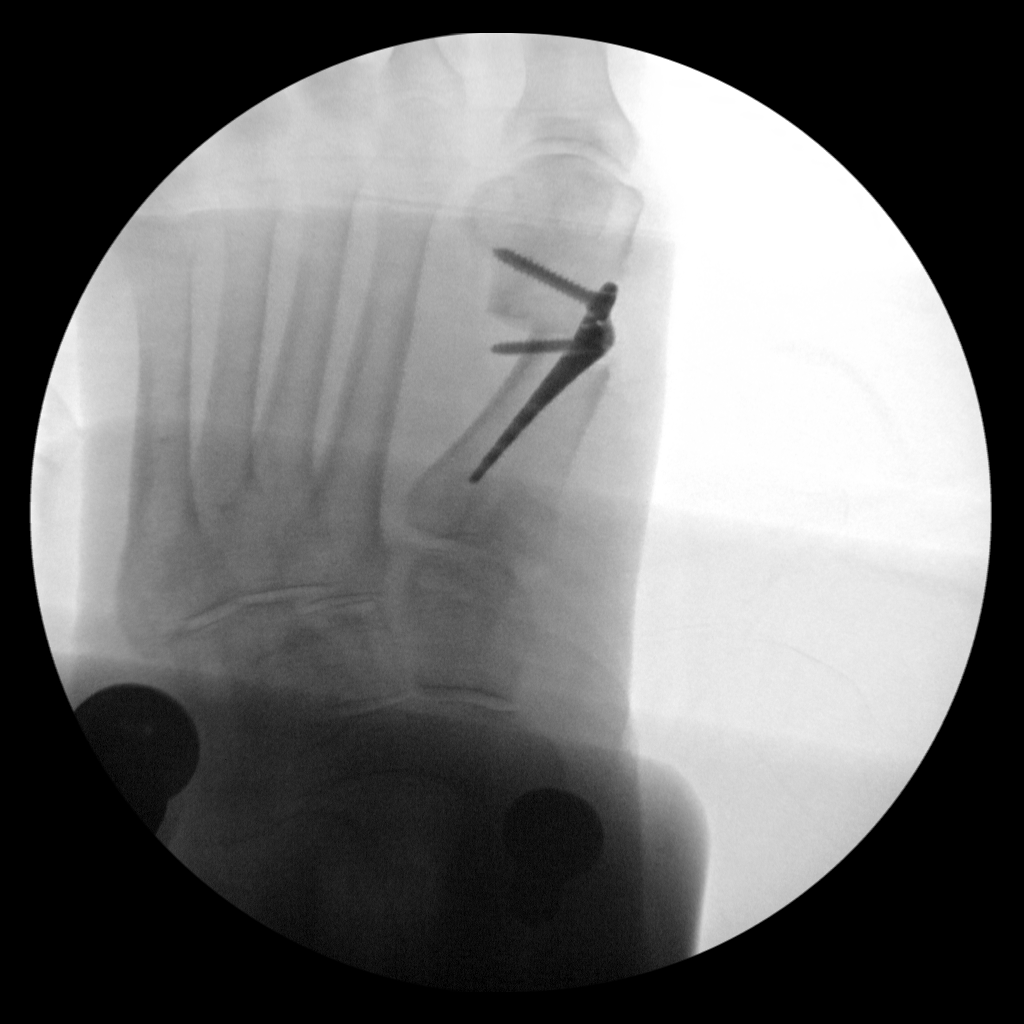
[im 2/4]
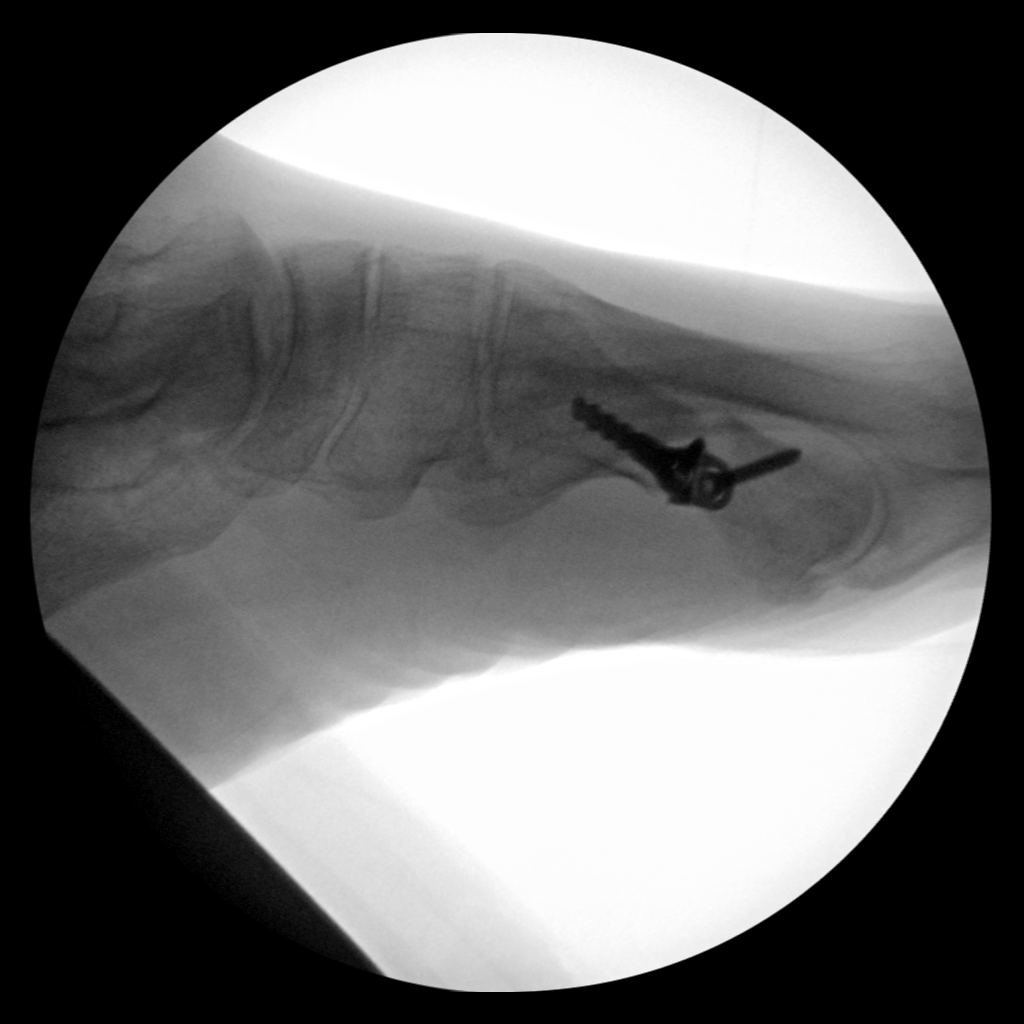
[im 3/4]
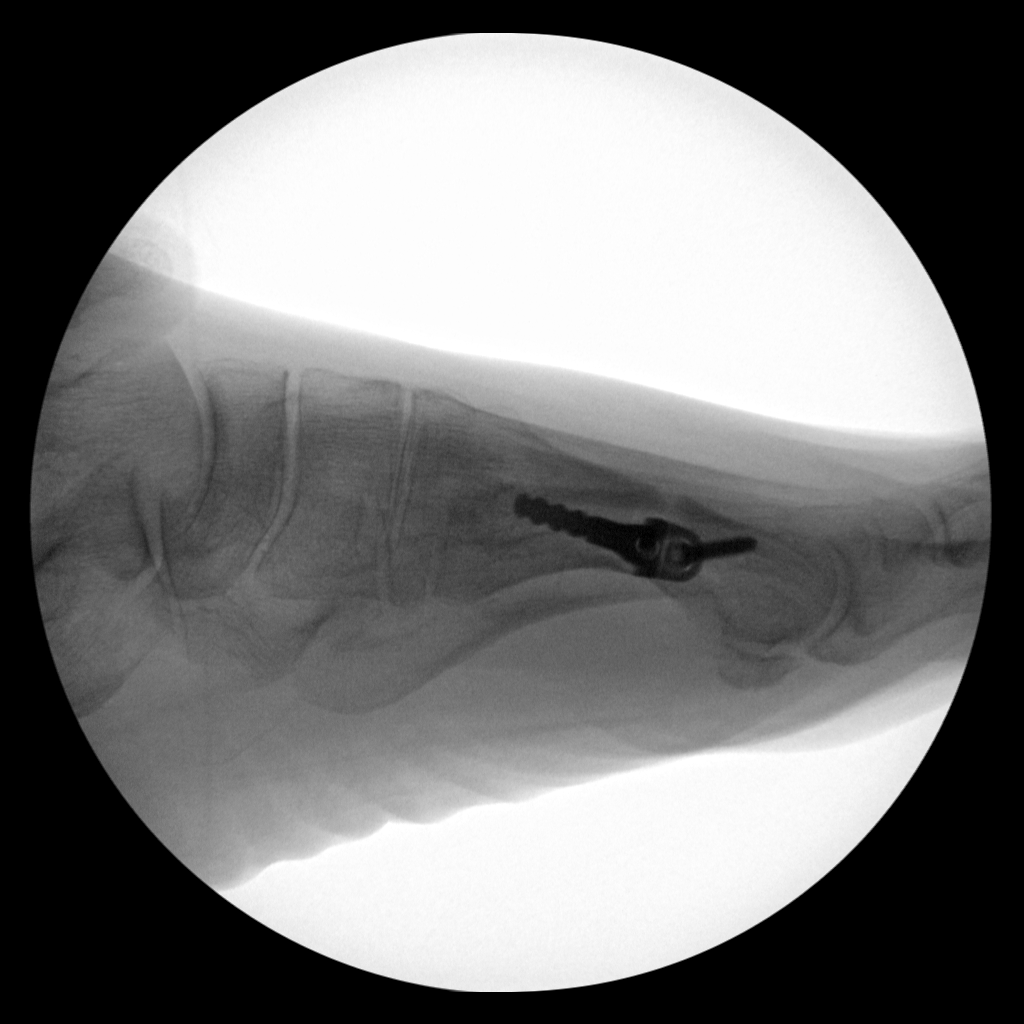
[im 4/4]
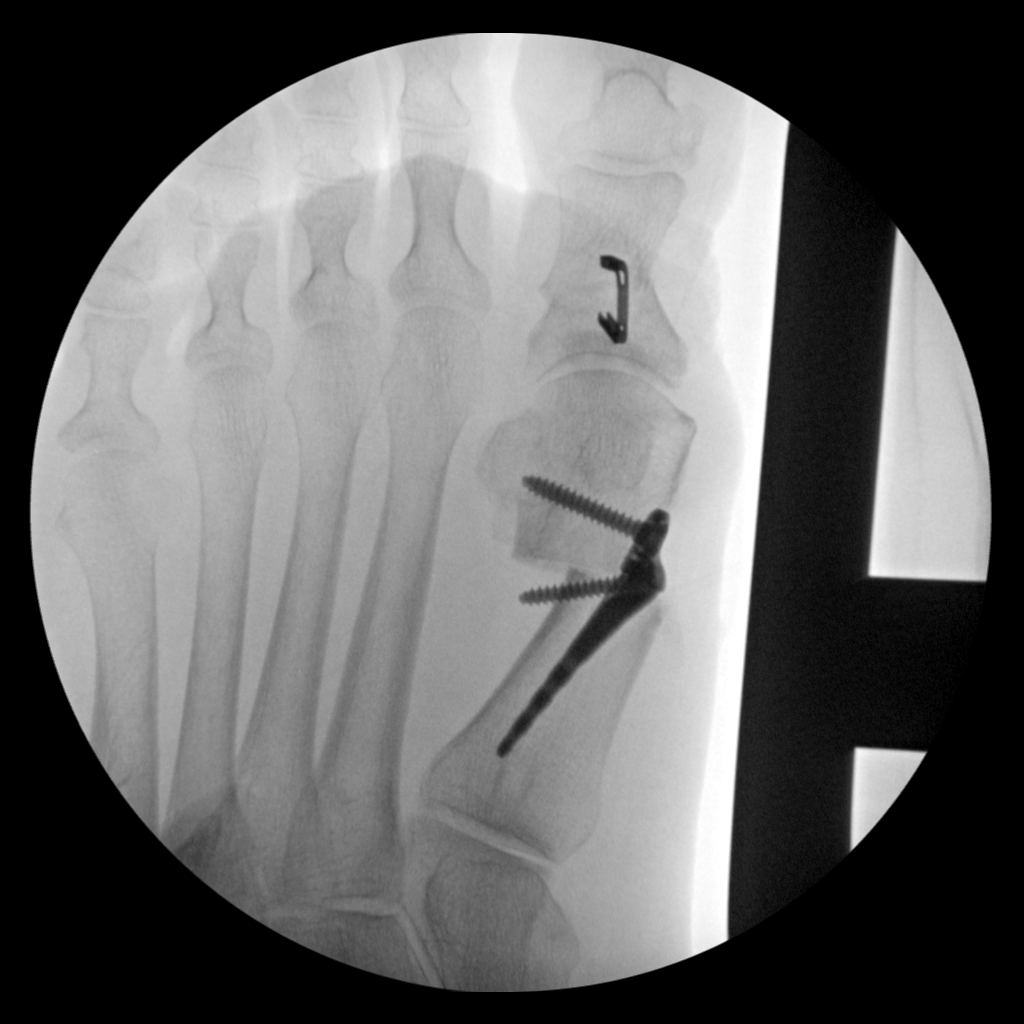

[4 of 4 positions shown; findings below may reference images not displayed]

FINDINGS: Images were performed intraoperatively without the presence of a
radiologist. The patient is undergoing distal left great toe
metatarsal shaft osteotomy with associated fixation hardware.
Additional great toe proximal phalangeal osteotomy with fixation
surgical staple.

Total fluoroscopy images: 4

Total fluoroscopy time: 40 seconds

Total dose: Radiation Exposure Index (as provided by the
fluoroscopic device): 1.38 mGy air Kerma

Please see intraoperative findings for further detail.
IMPRESSION: Intraoperative fluoroscopy for great toe metatarsal and proximal
phalanx osteotomies.
